# Patient Record
Sex: Female | Born: 1985
Health system: Southern US, Community
[De-identification: ages and names within clinical notes are randomized; demographics above are authoritative.]

## PROBLEM LIST (undated history)

## (undated) ENCOUNTER — Inpatient Hospital Stay (HOSPITAL_COMMUNITY): Payer: Self-pay

## (undated) DIAGNOSIS — F909 Attention-deficit hyperactivity disorder, unspecified type: Secondary | ICD-10-CM

## (undated) DIAGNOSIS — K219 Gastro-esophageal reflux disease without esophagitis: Secondary | ICD-10-CM

## (undated) DIAGNOSIS — G43909 Migraine, unspecified, not intractable, without status migrainosus: Secondary | ICD-10-CM

## (undated) HISTORY — PX: ESOPHAGOGASTRODUODENOSCOPY: SHX1529

## (undated) HISTORY — DX: Migraine, unspecified, not intractable, without status migrainosus: G43.909

## (undated) HISTORY — DX: Attention-deficit hyperactivity disorder, unspecified type: F90.9

## (undated) HISTORY — DX: Gastro-esophageal reflux disease without esophagitis: K21.9

---

## 2009-07-07 HISTORY — PX: COLONOSCOPY: SHX174

## 2010-07-07 HISTORY — PX: BREAST BIOPSY: SHX20

## 2016-01-29 MED FILL — NUVARING VAGINAL RING: 0.12-0.015 | 28 days supply | Qty: 1 | Fill #0

## 2016-02-26 MED FILL — NUVARING VAGINAL RING: 0.12-0.015 | 63 days supply | Qty: 2 | Fill #1

## 2016-03-26 DIAGNOSIS — R635 Abnormal weight gain: Secondary | ICD-10-CM | POA: Diagnosis not present

## 2016-03-26 DIAGNOSIS — Z6825 Body mass index (BMI) 25.0-25.9, adult: Secondary | ICD-10-CM | POA: Diagnosis not present

## 2016-03-26 DIAGNOSIS — K921 Melena: Secondary | ICD-10-CM | POA: Diagnosis not present

## 2016-04-03 DIAGNOSIS — Z01419 Encounter for gynecological examination (general) (routine) without abnormal findings: Secondary | ICD-10-CM | POA: Diagnosis not present

## 2016-04-03 DIAGNOSIS — Z124 Encounter for screening for malignant neoplasm of cervix: Secondary | ICD-10-CM | POA: Diagnosis not present

## 2016-04-03 DIAGNOSIS — N909 Noninflammatory disorder of vulva and perineum, unspecified: Secondary | ICD-10-CM | POA: Diagnosis not present

## 2016-04-03 DIAGNOSIS — Z6826 Body mass index (BMI) 26.0-26.9, adult: Secondary | ICD-10-CM | POA: Diagnosis not present

## 2016-04-03 DIAGNOSIS — Z113 Encounter for screening for infections with a predominantly sexual mode of transmission: Secondary | ICD-10-CM | POA: Diagnosis not present

## 2016-04-03 MED FILL — valACYclovir HCL 1 GM TABS: 1 | 10 days supply | Qty: 20 | Fill #0

## 2016-04-04 MED FILL — LIDOCAINE HCL 2% JELLY: 2 | 10 days supply | Qty: 30 | Fill #0

## 2016-04-23 MED FILL — NUVARING VAGINAL RING: 0.12-0.015 | 84 days supply | Qty: 3 | Fill #0

## 2016-07-08 MED FILL — NUVARING VAGINAL RING: 0.12-0.015 | 84 days supply | Qty: 3 | Fill #1 | Status: TO

## 2016-10-07 MED FILL — NUVARING VAGINAL RING: 0.12-0.015 | 84 days supply | Qty: 3 | Fill #0

## 2016-12-09 DIAGNOSIS — M9905 Segmental and somatic dysfunction of pelvic region: Secondary | ICD-10-CM | POA: Diagnosis not present

## 2016-12-09 DIAGNOSIS — M9902 Segmental and somatic dysfunction of thoracic region: Secondary | ICD-10-CM | POA: Diagnosis not present

## 2016-12-09 DIAGNOSIS — M791 Myalgia: Secondary | ICD-10-CM | POA: Diagnosis not present

## 2016-12-09 DIAGNOSIS — M9903 Segmental and somatic dysfunction of lumbar region: Secondary | ICD-10-CM | POA: Diagnosis not present

## 2017-01-19 MED FILL — NUVARING VAGINAL RING: 0.12-0.015 | 84 days supply | Qty: 3 | Fill #1

## 2017-03-26 DIAGNOSIS — Z6832 Body mass index (BMI) 32.0-32.9, adult: Secondary | ICD-10-CM | POA: Diagnosis not present

## 2017-03-26 DIAGNOSIS — R5383 Other fatigue: Secondary | ICD-10-CM | POA: Diagnosis not present

## 2017-03-26 DIAGNOSIS — R0789 Other chest pain: Secondary | ICD-10-CM | POA: Diagnosis not present

## 2017-03-27 DIAGNOSIS — M25572 Pain in left ankle and joints of left foot: Secondary | ICD-10-CM | POA: Diagnosis not present

## 2017-03-27 DIAGNOSIS — M25571 Pain in right ankle and joints of right foot: Secondary | ICD-10-CM | POA: Diagnosis not present

## 2017-03-30 DIAGNOSIS — M25571 Pain in right ankle and joints of right foot: Secondary | ICD-10-CM | POA: Diagnosis not present

## 2017-04-02 DIAGNOSIS — R5383 Other fatigue: Secondary | ICD-10-CM | POA: Diagnosis not present

## 2017-04-02 DIAGNOSIS — R0789 Other chest pain: Secondary | ICD-10-CM | POA: Diagnosis not present

## 2017-04-02 DIAGNOSIS — Z6832 Body mass index (BMI) 32.0-32.9, adult: Secondary | ICD-10-CM | POA: Diagnosis not present

## 2017-04-20 DIAGNOSIS — Z6825 Body mass index (BMI) 25.0-25.9, adult: Secondary | ICD-10-CM | POA: Diagnosis not present

## 2017-04-20 DIAGNOSIS — Z3040 Encounter for surveillance of contraceptives, unspecified: Secondary | ICD-10-CM | POA: Diagnosis not present

## 2017-04-20 DIAGNOSIS — Z01419 Encounter for gynecological examination (general) (routine) without abnormal findings: Secondary | ICD-10-CM | POA: Diagnosis not present

## 2017-04-20 MED FILL — metroNIDAZOLE 500 MG TABS: 500 | 7 days supply | Qty: 14 | Fill #0

## 2017-04-23 DIAGNOSIS — K625 Hemorrhage of anus and rectum: Secondary | ICD-10-CM | POA: Diagnosis not present

## 2017-04-23 DIAGNOSIS — Z6825 Body mass index (BMI) 25.0-25.9, adult: Secondary | ICD-10-CM | POA: Diagnosis not present

## 2017-04-23 DIAGNOSIS — K649 Unspecified hemorrhoids: Secondary | ICD-10-CM | POA: Diagnosis not present

## 2017-04-23 MED FILL — HEMMOREX-HC 25 MG SUPP: 25 | 3 days supply | Qty: 6 | Fill #0

## 2017-07-23 ENCOUNTER — Ambulatory Visit (HOSPITAL_COMMUNITY): Payer: 59 | Admitting: Licensed Clinical Social Worker

## 2017-08-10 ENCOUNTER — Ambulatory Visit (HOSPITAL_COMMUNITY): Payer: 59 | Admitting: Licensed Clinical Social Worker

## 2017-08-18 ENCOUNTER — Ambulatory Visit (HOSPITAL_COMMUNITY): Payer: 59 | Admitting: Licensed Clinical Social Worker

## 2017-09-04 DIAGNOSIS — M542 Cervicalgia: Secondary | ICD-10-CM | POA: Diagnosis not present

## 2017-09-04 DIAGNOSIS — M9903 Segmental and somatic dysfunction of lumbar region: Secondary | ICD-10-CM | POA: Diagnosis not present

## 2017-09-04 DIAGNOSIS — M9905 Segmental and somatic dysfunction of pelvic region: Secondary | ICD-10-CM | POA: Diagnosis not present

## 2017-09-04 DIAGNOSIS — M222X1 Patellofemoral disorders, right knee: Secondary | ICD-10-CM | POA: Diagnosis not present

## 2017-09-04 DIAGNOSIS — M7918 Myalgia, other site: Secondary | ICD-10-CM | POA: Diagnosis not present

## 2017-09-04 DIAGNOSIS — M9902 Segmental and somatic dysfunction of thoracic region: Secondary | ICD-10-CM | POA: Diagnosis not present

## 2017-09-04 DIAGNOSIS — M7912 Myalgia of auxiliary muscles, head and neck: Secondary | ICD-10-CM | POA: Diagnosis not present

## 2017-09-04 DIAGNOSIS — M222X2 Patellofemoral disorders, left knee: Secondary | ICD-10-CM | POA: Diagnosis not present

## 2017-09-04 DIAGNOSIS — M9901 Segmental and somatic dysfunction of cervical region: Secondary | ICD-10-CM | POA: Diagnosis not present

## 2017-09-16 DIAGNOSIS — M7918 Myalgia, other site: Secondary | ICD-10-CM | POA: Diagnosis not present

## 2017-09-16 DIAGNOSIS — M9902 Segmental and somatic dysfunction of thoracic region: Secondary | ICD-10-CM | POA: Diagnosis not present

## 2017-09-16 DIAGNOSIS — M9905 Segmental and somatic dysfunction of pelvic region: Secondary | ICD-10-CM | POA: Diagnosis not present

## 2017-09-16 DIAGNOSIS — M222X2 Patellofemoral disorders, left knee: Secondary | ICD-10-CM | POA: Diagnosis not present

## 2017-09-16 DIAGNOSIS — M542 Cervicalgia: Secondary | ICD-10-CM | POA: Diagnosis not present

## 2017-09-16 DIAGNOSIS — M9903 Segmental and somatic dysfunction of lumbar region: Secondary | ICD-10-CM | POA: Diagnosis not present

## 2017-09-16 DIAGNOSIS — M222X1 Patellofemoral disorders, right knee: Secondary | ICD-10-CM | POA: Diagnosis not present

## 2017-09-16 DIAGNOSIS — M7912 Myalgia of auxiliary muscles, head and neck: Secondary | ICD-10-CM | POA: Diagnosis not present

## 2017-09-16 DIAGNOSIS — M9901 Segmental and somatic dysfunction of cervical region: Secondary | ICD-10-CM | POA: Diagnosis not present

## 2017-09-28 DIAGNOSIS — M542 Cervicalgia: Secondary | ICD-10-CM | POA: Diagnosis not present

## 2017-09-28 DIAGNOSIS — M222X1 Patellofemoral disorders, right knee: Secondary | ICD-10-CM | POA: Diagnosis not present

## 2017-09-28 DIAGNOSIS — M9902 Segmental and somatic dysfunction of thoracic region: Secondary | ICD-10-CM | POA: Diagnosis not present

## 2017-09-28 DIAGNOSIS — M9901 Segmental and somatic dysfunction of cervical region: Secondary | ICD-10-CM | POA: Diagnosis not present

## 2017-09-28 DIAGNOSIS — M222X2 Patellofemoral disorders, left knee: Secondary | ICD-10-CM | POA: Diagnosis not present

## 2017-09-28 DIAGNOSIS — M7918 Myalgia, other site: Secondary | ICD-10-CM | POA: Diagnosis not present

## 2017-09-28 DIAGNOSIS — M7912 Myalgia of auxiliary muscles, head and neck: Secondary | ICD-10-CM | POA: Diagnosis not present

## 2017-09-28 DIAGNOSIS — M9905 Segmental and somatic dysfunction of pelvic region: Secondary | ICD-10-CM | POA: Diagnosis not present

## 2017-09-28 DIAGNOSIS — M9903 Segmental and somatic dysfunction of lumbar region: Secondary | ICD-10-CM | POA: Diagnosis not present

## 2017-10-16 DIAGNOSIS — M222X1 Patellofemoral disorders, right knee: Secondary | ICD-10-CM | POA: Diagnosis not present

## 2017-10-16 DIAGNOSIS — M222X2 Patellofemoral disorders, left knee: Secondary | ICD-10-CM | POA: Diagnosis not present

## 2017-10-16 DIAGNOSIS — M9901 Segmental and somatic dysfunction of cervical region: Secondary | ICD-10-CM | POA: Diagnosis not present

## 2017-10-16 DIAGNOSIS — M542 Cervicalgia: Secondary | ICD-10-CM | POA: Diagnosis not present

## 2017-10-16 DIAGNOSIS — M7912 Myalgia of auxiliary muscles, head and neck: Secondary | ICD-10-CM | POA: Diagnosis not present

## 2017-10-16 DIAGNOSIS — M9905 Segmental and somatic dysfunction of pelvic region: Secondary | ICD-10-CM | POA: Diagnosis not present

## 2017-10-16 DIAGNOSIS — M9902 Segmental and somatic dysfunction of thoracic region: Secondary | ICD-10-CM | POA: Diagnosis not present

## 2017-10-16 DIAGNOSIS — M7918 Myalgia, other site: Secondary | ICD-10-CM | POA: Diagnosis not present

## 2017-10-16 DIAGNOSIS — M9903 Segmental and somatic dysfunction of lumbar region: Secondary | ICD-10-CM | POA: Diagnosis not present

## 2017-11-03 DIAGNOSIS — M9905 Segmental and somatic dysfunction of pelvic region: Secondary | ICD-10-CM | POA: Diagnosis not present

## 2017-11-03 DIAGNOSIS — M9902 Segmental and somatic dysfunction of thoracic region: Secondary | ICD-10-CM | POA: Diagnosis not present

## 2017-11-03 DIAGNOSIS — M222X1 Patellofemoral disorders, right knee: Secondary | ICD-10-CM | POA: Diagnosis not present

## 2017-11-03 DIAGNOSIS — M542 Cervicalgia: Secondary | ICD-10-CM | POA: Diagnosis not present

## 2017-11-03 DIAGNOSIS — M222X2 Patellofemoral disorders, left knee: Secondary | ICD-10-CM | POA: Diagnosis not present

## 2017-11-03 DIAGNOSIS — M9901 Segmental and somatic dysfunction of cervical region: Secondary | ICD-10-CM | POA: Diagnosis not present

## 2017-11-03 DIAGNOSIS — M7912 Myalgia of auxiliary muscles, head and neck: Secondary | ICD-10-CM | POA: Diagnosis not present

## 2017-11-03 DIAGNOSIS — M7918 Myalgia, other site: Secondary | ICD-10-CM | POA: Diagnosis not present

## 2017-11-03 DIAGNOSIS — M9903 Segmental and somatic dysfunction of lumbar region: Secondary | ICD-10-CM | POA: Diagnosis not present

## 2017-11-09 ENCOUNTER — Ambulatory Visit (INDEPENDENT_AMBULATORY_CARE_PROVIDER_SITE_OTHER): Payer: 59 | Admitting: Licensed Clinical Social Worker

## 2017-11-09 DIAGNOSIS — F331 Major depressive disorder, recurrent, moderate: Secondary | ICD-10-CM

## 2017-11-18 DIAGNOSIS — H1013 Acute atopic conjunctivitis, bilateral: Secondary | ICD-10-CM | POA: Diagnosis not present

## 2017-11-18 DIAGNOSIS — Z0000 Encounter for general adult medical examination without abnormal findings: Secondary | ICD-10-CM | POA: Diagnosis not present

## 2017-11-18 LAB — HEPATIC FUNCTION PANEL
ALT: 17 (ref 7–35)
AST: 16 (ref 13–35)
Alkaline Phosphatase: 62 (ref 25–125)
Bilirubin, Total: 0.3

## 2017-11-18 LAB — LIPID PANEL
Cholesterol: 198 (ref 0–200)
HDL: 77 — AB (ref 35–70)
LDL Cholesterol: 100
Triglycerides: 107 (ref 40–160)

## 2017-11-18 LAB — CBC AND DIFFERENTIAL
HCT: 39 (ref 36–46)
Hemoglobin: 12.9 (ref 12.0–16.0)
Neutrophils Absolute: 54
Platelets: 269 (ref 150–399)
WBC: 4.2

## 2017-11-18 LAB — COMPREHENSIVE METABOLIC PANEL
Albumin: 4.1 (ref 3.5–5.0)
Calcium: 8.9 (ref 8.7–10.7)
GFR calc Af Amer: 97
GFR calc non Af Amer: 84
Globulin: 2.2

## 2017-11-18 LAB — CBC: RBC: 4.44 (ref 3.87–5.11)

## 2017-11-18 LAB — BASIC METABOLIC PANEL
BUN: 11 (ref 4–21)
CO2: 22 (ref 13–22)
Chloride: 105 (ref 99–108)
Creatinine: 0.9 (ref 0.5–1.1)
Glucose: 90
Potassium: 4.8 (ref 3.4–5.3)
Sodium: 139 (ref 137–147)

## 2017-11-18 LAB — HIV ANTIBODY (ROUTINE TESTING W REFLEX): HIV 1&2 Ab, 4th Generation: NONREACTIVE

## 2017-11-18 LAB — TSH: TSH: 2.43 (ref 0.41–5.90)

## 2017-11-24 ENCOUNTER — Ambulatory Visit (INDEPENDENT_AMBULATORY_CARE_PROVIDER_SITE_OTHER): Payer: 59 | Admitting: Psychology

## 2017-11-24 DIAGNOSIS — Z6825 Body mass index (BMI) 25.0-25.9, adult: Secondary | ICD-10-CM | POA: Diagnosis not present

## 2017-11-24 DIAGNOSIS — F908 Attention-deficit hyperactivity disorder, other type: Secondary | ICD-10-CM | POA: Diagnosis not present

## 2017-11-24 DIAGNOSIS — F909 Attention-deficit hyperactivity disorder, unspecified type: Secondary | ICD-10-CM | POA: Diagnosis not present

## 2017-11-24 DIAGNOSIS — F4323 Adjustment disorder with mixed anxiety and depressed mood: Secondary | ICD-10-CM | POA: Diagnosis not present

## 2017-11-24 DIAGNOSIS — Z0000 Encounter for general adult medical examination without abnormal findings: Secondary | ICD-10-CM | POA: Diagnosis not present

## 2017-11-24 DIAGNOSIS — F4321 Adjustment disorder with depressed mood: Secondary | ICD-10-CM

## 2017-11-24 MED FILL — ADDERALL XR 15 MG CAP SA: 15 | 30 days supply | Qty: 30 | Fill #0

## 2017-12-21 MED FILL — NUVARING VAGINAL RING: 0.12-0.015 | 84 days supply | Qty: 3 | Fill #0

## 2017-12-22 ENCOUNTER — Ambulatory Visit: Payer: Self-pay | Admitting: Licensed Clinical Social Worker

## 2017-12-22 ENCOUNTER — Ambulatory Visit: Payer: 59 | Admitting: Psychology

## 2017-12-22 DIAGNOSIS — Z6824 Body mass index (BMI) 24.0-24.9, adult: Secondary | ICD-10-CM | POA: Diagnosis not present

## 2017-12-22 DIAGNOSIS — F909 Attention-deficit hyperactivity disorder, unspecified type: Secondary | ICD-10-CM | POA: Diagnosis not present

## 2017-12-24 MED FILL — ADDERALL XR 15 MG CAP SA: 15 | 30 days supply | Qty: 30 | Fill #0

## 2017-12-25 MED FILL — VALACYCLOVIR HCL 500 MG TAB: 500 | 7 days supply | Qty: 14 | Fill #0

## 2018-01-22 MED FILL — ADDERALL XR 15 MG CAP SA: 15 | 30 days supply | Qty: 30 | Fill #0

## 2018-02-23 MED FILL — ADDERALL XR 15 MG CAP SA: 15 | 30 days supply | Qty: 30 | Fill #0

## 2018-03-15 DIAGNOSIS — M9903 Segmental and somatic dysfunction of lumbar region: Secondary | ICD-10-CM | POA: Diagnosis not present

## 2018-03-15 DIAGNOSIS — M9901 Segmental and somatic dysfunction of cervical region: Secondary | ICD-10-CM | POA: Diagnosis not present

## 2018-03-15 DIAGNOSIS — Z01419 Encounter for gynecological examination (general) (routine) without abnormal findings: Secondary | ICD-10-CM | POA: Diagnosis not present

## 2018-03-15 DIAGNOSIS — M7912 Myalgia of auxiliary muscles, head and neck: Secondary | ICD-10-CM | POA: Diagnosis not present

## 2018-03-15 DIAGNOSIS — M9905 Segmental and somatic dysfunction of pelvic region: Secondary | ICD-10-CM | POA: Diagnosis not present

## 2018-03-15 DIAGNOSIS — N760 Acute vaginitis: Secondary | ICD-10-CM | POA: Diagnosis not present

## 2018-03-15 DIAGNOSIS — M222X1 Patellofemoral disorders, right knee: Secondary | ICD-10-CM | POA: Diagnosis not present

## 2018-03-15 DIAGNOSIS — M222X2 Patellofemoral disorders, left knee: Secondary | ICD-10-CM | POA: Diagnosis not present

## 2018-03-15 DIAGNOSIS — M7918 Myalgia, other site: Secondary | ICD-10-CM | POA: Diagnosis not present

## 2018-03-15 DIAGNOSIS — Z6823 Body mass index (BMI) 23.0-23.9, adult: Secondary | ICD-10-CM | POA: Diagnosis not present

## 2018-03-15 DIAGNOSIS — M9902 Segmental and somatic dysfunction of thoracic region: Secondary | ICD-10-CM | POA: Diagnosis not present

## 2018-03-15 DIAGNOSIS — M542 Cervicalgia: Secondary | ICD-10-CM | POA: Diagnosis not present

## 2018-03-23 DIAGNOSIS — F909 Attention-deficit hyperactivity disorder, unspecified type: Secondary | ICD-10-CM | POA: Diagnosis not present

## 2018-03-23 DIAGNOSIS — Z6824 Body mass index (BMI) 24.0-24.9, adult: Secondary | ICD-10-CM | POA: Diagnosis not present

## 2018-03-25 MED FILL — NUVARING VAGINAL RING: 0.12-0.015 | 84 days supply | Qty: 3 | Fill #0

## 2018-03-25 MED FILL — ADDERALL XR 15 MG CAP SA: 15 | 30 days supply | Qty: 30 | Fill #0

## 2018-03-29 ENCOUNTER — Encounter (HOSPITAL_COMMUNITY): Payer: Self-pay

## 2018-03-29 ENCOUNTER — Ambulatory Visit (HOSPITAL_COMMUNITY): Payer: 59

## 2018-04-27 MED FILL — ADDERALL XR 15 MG CAP SA: 15 | 30 days supply | Qty: 30 | Fill #0

## 2018-06-02 MED FILL — ADDERALL XR 15 MG CAP SA: 15 | 30 days supply | Qty: 30 | Fill #0

## 2018-06-11 DIAGNOSIS — M9903 Segmental and somatic dysfunction of lumbar region: Secondary | ICD-10-CM | POA: Diagnosis not present

## 2018-06-11 DIAGNOSIS — J069 Acute upper respiratory infection, unspecified: Secondary | ICD-10-CM | POA: Diagnosis not present

## 2018-06-11 DIAGNOSIS — M7918 Myalgia, other site: Secondary | ICD-10-CM | POA: Diagnosis not present

## 2018-06-11 DIAGNOSIS — M9901 Segmental and somatic dysfunction of cervical region: Secondary | ICD-10-CM | POA: Diagnosis not present

## 2018-06-11 DIAGNOSIS — M542 Cervicalgia: Secondary | ICD-10-CM | POA: Diagnosis not present

## 2018-06-11 DIAGNOSIS — M9905 Segmental and somatic dysfunction of pelvic region: Secondary | ICD-10-CM | POA: Diagnosis not present

## 2018-06-11 DIAGNOSIS — M9902 Segmental and somatic dysfunction of thoracic region: Secondary | ICD-10-CM | POA: Diagnosis not present

## 2018-06-11 DIAGNOSIS — M7912 Myalgia of auxiliary muscles, head and neck: Secondary | ICD-10-CM | POA: Diagnosis not present

## 2018-06-11 DIAGNOSIS — M545 Low back pain: Secondary | ICD-10-CM | POA: Diagnosis not present

## 2018-06-11 DIAGNOSIS — M546 Pain in thoracic spine: Secondary | ICD-10-CM | POA: Diagnosis not present

## 2018-06-22 DIAGNOSIS — J069 Acute upper respiratory infection, unspecified: Secondary | ICD-10-CM | POA: Diagnosis not present

## 2018-06-22 DIAGNOSIS — Z6823 Body mass index (BMI) 23.0-23.9, adult: Secondary | ICD-10-CM | POA: Diagnosis not present

## 2018-06-22 DIAGNOSIS — F909 Attention-deficit hyperactivity disorder, unspecified type: Secondary | ICD-10-CM | POA: Diagnosis not present

## 2018-07-05 MED FILL — ADDERALL XR 15 MG CAP SA: 15 | 30 days supply | Qty: 30 | Fill #0

## 2018-07-12 MED FILL — ETONOGESTREL-ETHINYL ESTRAD: 0.12-0.015 | 84 days supply | Qty: 3 | Fill #1

## 2018-08-13 MED FILL — ADDERALL XR 15 MG CAP SA: 15 | 30 days supply | Qty: 30 | Fill #0

## 2018-09-10 MED FILL — ADDERALL XR 15 MG CAP SA: 15 | 30 days supply | Qty: 30 | Fill #0

## 2018-09-25 MED FILL — ETONOGESTREL-ETHINYL ESTRAD: 0.12-0.015 | 84 days supply | Qty: 3 | Fill #2

## 2018-09-27 DIAGNOSIS — R6882 Decreased libido: Secondary | ICD-10-CM | POA: Diagnosis not present

## 2018-09-27 DIAGNOSIS — Z719 Counseling, unspecified: Secondary | ICD-10-CM | POA: Diagnosis not present

## 2018-09-27 DIAGNOSIS — F909 Attention-deficit hyperactivity disorder, unspecified type: Secondary | ICD-10-CM | POA: Diagnosis not present

## 2018-09-27 MED FILL — AMPHETAMINE-DEXTROAMPHETAMI: 10 | 30 days supply | Qty: 90 | Fill #0

## 2018-11-05 DIAGNOSIS — F909 Attention-deficit hyperactivity disorder, unspecified type: Secondary | ICD-10-CM | POA: Diagnosis not present

## 2018-11-05 DIAGNOSIS — R6882 Decreased libido: Secondary | ICD-10-CM | POA: Diagnosis not present

## 2018-11-05 DIAGNOSIS — R454 Irritability and anger: Secondary | ICD-10-CM | POA: Diagnosis not present

## 2018-11-05 MED FILL — ATOMOXETINE HCL 40 MG CAPS: 40 | 30 days supply | Qty: 30 | Fill #0

## 2018-11-17 DIAGNOSIS — R51 Headache: Secondary | ICD-10-CM | POA: Diagnosis not present

## 2018-11-17 DIAGNOSIS — K5900 Constipation, unspecified: Secondary | ICD-10-CM | POA: Diagnosis not present

## 2018-11-17 DIAGNOSIS — F909 Attention-deficit hyperactivity disorder, unspecified type: Secondary | ICD-10-CM | POA: Diagnosis not present

## 2018-11-17 MED FILL — MYDAYIS ER 12.5 MG CAPSULE: 12.5 | 30 days supply | Qty: 30 | Fill #0

## 2018-12-14 DIAGNOSIS — F909 Attention-deficit hyperactivity disorder, unspecified type: Secondary | ICD-10-CM | POA: Diagnosis not present

## 2018-12-14 DIAGNOSIS — Z719 Counseling, unspecified: Secondary | ICD-10-CM | POA: Diagnosis not present

## 2018-12-14 DIAGNOSIS — J309 Allergic rhinitis, unspecified: Secondary | ICD-10-CM | POA: Diagnosis not present

## 2018-12-15 MED FILL — MYDAYIS ER 25 MG CAPSULE: 25 | 30 days supply | Qty: 30 | Fill #0

## 2018-12-30 MED FILL — ETONOGESTREL-ETHINYL ESTRAD: 0.12-0.015 | 84 days supply | Qty: 3 | Fill #3

## 2019-02-03 MED FILL — MYDAYIS ER 25 MG CAPSULE: 25 | 30 days supply | Qty: 30 | Fill #0

## 2019-03-04 MED FILL — MYDAYIS ER 25 MG CAPSULE: 25 | 30 days supply | Qty: 30 | Fill #0

## 2019-03-16 DIAGNOSIS — F411 Generalized anxiety disorder: Secondary | ICD-10-CM | POA: Diagnosis not present

## 2019-03-16 DIAGNOSIS — I7300 Raynaud's syndrome without gangrene: Secondary | ICD-10-CM | POA: Diagnosis not present

## 2019-03-16 DIAGNOSIS — R6889 Other general symptoms and signs: Secondary | ICD-10-CM | POA: Diagnosis not present

## 2019-03-16 DIAGNOSIS — F909 Attention-deficit hyperactivity disorder, unspecified type: Secondary | ICD-10-CM | POA: Diagnosis not present

## 2019-03-16 MED FILL — AMLODIPINE 2.5 MG TABLET: 2.5 | 90 days supply | Qty: 90 | Fill #0

## 2019-04-04 MED FILL — ETONOGESTREL-ETHINYL ESTRAD: 0.12-0.015 | 28 days supply | Qty: 1 | Fill #0

## 2019-04-13 DIAGNOSIS — Z23 Encounter for immunization: Secondary | ICD-10-CM | POA: Diagnosis not present

## 2019-04-13 DIAGNOSIS — Z111 Encounter for screening for respiratory tuberculosis: Secondary | ICD-10-CM | POA: Diagnosis not present

## 2019-04-13 DIAGNOSIS — R5383 Other fatigue: Secondary | ICD-10-CM | POA: Diagnosis not present

## 2019-04-13 DIAGNOSIS — N898 Other specified noninflammatory disorders of vagina: Secondary | ICD-10-CM | POA: Diagnosis not present

## 2019-04-13 DIAGNOSIS — Z3040 Encounter for surveillance of contraceptives, unspecified: Secondary | ICD-10-CM | POA: Diagnosis not present

## 2019-04-13 DIAGNOSIS — I7300 Raynaud's syndrome without gangrene: Secondary | ICD-10-CM | POA: Diagnosis not present

## 2019-04-13 DIAGNOSIS — Z01419 Encounter for gynecological examination (general) (routine) without abnormal findings: Secondary | ICD-10-CM | POA: Diagnosis not present

## 2019-04-13 DIAGNOSIS — Z3002 Counseling and instruction in natural family planning to avoid pregnancy: Secondary | ICD-10-CM | POA: Diagnosis not present

## 2019-04-13 DIAGNOSIS — Z6823 Body mass index (BMI) 23.0-23.9, adult: Secondary | ICD-10-CM | POA: Diagnosis not present

## 2019-04-13 LAB — HEPATIC FUNCTION PANEL
ALT: 12 (ref 7–35)
AST: 18 (ref 13–35)
Alkaline Phosphatase: 67 (ref 25–125)
Bilirubin, Total: 0.4

## 2019-04-13 LAB — CBC: RBC: 4.88 (ref 3.87–5.11)

## 2019-04-13 LAB — COMPREHENSIVE METABOLIC PANEL
Albumin: 4.5 (ref 3.5–5.0)
Calcium: 9.2 (ref 8.7–10.7)
GFR calc Af Amer: 95
GFR calc non Af Amer: 82
Globulin: 2.8

## 2019-04-13 LAB — BASIC METABOLIC PANEL
BUN: 9 (ref 4–21)
CO2: 22 (ref 13–22)
Chloride: 101 (ref 99–108)
Creatinine: 0.9 (ref 0.5–1.1)
Glucose: 83
Potassium: 4.2 (ref 3.4–5.3)
Sodium: 137 (ref 137–147)

## 2019-04-13 LAB — VITAMIN D 25 HYDROXY (VIT D DEFICIENCY, FRACTURES): Vit D, 25-Hydroxy: 21.4

## 2019-04-13 LAB — CBC AND DIFFERENTIAL
HCT: 41 (ref 36–46)
Hemoglobin: 14.3 (ref 12.0–16.0)
Neutrophils Absolute: 61
Platelets: 284 (ref 150–399)
WBC: 6

## 2019-04-13 LAB — TSH: TSH: 3.14 (ref 0.41–5.90)

## 2019-04-21 ENCOUNTER — Ambulatory Visit (HOSPITAL_COMMUNITY): Payer: 59

## 2019-04-21 ENCOUNTER — Encounter (HOSPITAL_COMMUNITY): Payer: Self-pay

## 2019-05-02 DIAGNOSIS — F411 Generalized anxiety disorder: Secondary | ICD-10-CM | POA: Diagnosis not present

## 2019-05-02 DIAGNOSIS — F909 Attention-deficit hyperactivity disorder, unspecified type: Secondary | ICD-10-CM | POA: Diagnosis not present

## 2019-05-02 DIAGNOSIS — G4452 New daily persistent headache (NDPH): Secondary | ICD-10-CM | POA: Diagnosis not present

## 2019-05-02 MED FILL — buPROPion HCL ER (XL) 150 M: 150 | 90 days supply | Qty: 90 | Fill #0

## 2019-05-02 MED FILL — ETONOGESTREL-ETHINYL ESTRAD: 0.12-0.015 | 84 days supply | Qty: 3 | Fill #0

## 2019-05-10 ENCOUNTER — Other Ambulatory Visit: Payer: Self-pay

## 2019-05-10 ENCOUNTER — Ambulatory Visit (HOSPITAL_COMMUNITY): Payer: Self-pay | Admitting: Genetic Counselor

## 2019-05-10 ENCOUNTER — Ambulatory Visit (HOSPITAL_COMMUNITY): Payer: 59

## 2019-05-10 ENCOUNTER — Encounter: Payer: Self-pay | Admitting: Genetic Counselor

## 2019-05-10 ENCOUNTER — Ambulatory Visit (HOSPITAL_COMMUNITY): Payer: 59 | Attending: Obstetrics and Gynecology | Admitting: Genetic Counselor

## 2019-05-10 DIAGNOSIS — Z820 Family history of epilepsy and other diseases of the nervous system: Secondary | ICD-10-CM | POA: Diagnosis not present

## 2019-05-10 DIAGNOSIS — Z31430 Encounter of female for testing for genetic disease carrier status for procreative management: Secondary | ICD-10-CM

## 2019-05-10 DIAGNOSIS — Z3169 Encounter for other general counseling and advice on procreation: Secondary | ICD-10-CM

## 2019-05-10 DIAGNOSIS — Z818 Family history of other mental and behavioral disorders: Secondary | ICD-10-CM

## 2019-05-10 DIAGNOSIS — Z8489 Family history of other specified conditions: Secondary | ICD-10-CM | POA: Diagnosis not present

## 2019-05-10 NOTE — Progress Notes (Signed)
05/10/2019  [name] 01-01-1986 MRN: [id number] DOV: 05/10/2019  Rachael [name] presented to the [facility] for a genetics consultation regarding her family history of Rett syndrome. [name] came to her appointment alone due to COVID-19 visitor restrictions.   Indication for genetic counseling - Family history of Rett syndrome  Prenatal history  RachaelHardiestypresented for a preconception consultation; thus, prenatal history was not reviewed.  Family History  A three generation pedigree was drafted and reviewed. The family history is remarkable for the following:  - Rachael [name] has a sister with Rett syndrome. Her sister is now in her 35s but is severely affected and functions around the level of a 35 year old. See Discussion section for more details.  - Rachael [name] has an extensive family history of cancer. She has a paternal aunt who was diagnosed with colorectal cancer in her 35s, a paternal aunt who died of uterine and ovarian cancer in her late 35s, and a paternal aunt who was diagnosed with breast cancer in her early 35s and ultimately passed away from it. She also has a maternal uncle who had prostate cancer in his late 35s or early 35s, and her maternal grandfather also had prostate cancer in his 35s but ultimately died of cholangiocarcinoma. Though most cancers are thought to be sporadic or due to environmental factors, some families appear to have a strong predisposition to cancers. When considering a family history of cancer, we look for common types of cancer in multiple family members occurring at younger than typical ages. We discussed the option of meeting with a cancer genetic counselor to learn about the risks for an inherited cancer syndrome in the family and discuss any possible screening or testing options available. [name] is encouraged to call to schedule an appointment with a cancer genetic counselor at the [facility] [phone]).  - Rachael [name] has a maternal uncle who has a son with hemophilia. We briefly reviewed that hemophilia is inherited in an X-linked fashion, meaning that her cousin had to have inherited hemophilia from his mother. Given that this aunt is not a blood relative, Rachael [name]'s risk of having a child with hemophilia is not increased over the general population.  The remaining family histories were reviewed and found to be noncontributory for birth defects, intellectual disability, recurrent pregnancy loss, and known genetic conditions.    The patient's ethnicity is [place]. The father of the pregnancy's ethnicity is [place], [place], [place], and [place]. Consanguinity was denied. [name] indicated that her husband has Ashkenazi Jewish ancestry. Pedigree will be scanned under Media.  Discussion  Rachael [name] was referred for genetic counseling as her sister has Rett syndrome. Rett syndrome is a genetic brain disorder that occurs almost exclusively in females. At birth, females with classic Rett syndrome experience 6-18 months of apparently normal development before developmentally stagnating and then experiencing rapid regression in language and motor skills. The main findings associated with Rett syndrome include a partial or complete loss of purposeful hand skills, a partial or complete loss of acquired language skills, gait abnormalities, and stereotypic hand movements including hang wringing, squeezing, clapping, tapping, mouthing, or washing/rubbing automatisms. Other signs and symptoms of Rett syndrome include growth retardation, microcephaly, breathing abnormalities, spitting or drooling, unusual eye movements such as intense staring or excessive blinking, cold hands and feet, irritability, sleep disturbances, seizures, and scoliosis. Individuals with Rett syndrome may also have behavioral abnormalities such as inappropriate laughing or screaming spells,  panic-like  attacks, and autistic features.   Almost all cases of Rett syndrome are caused by pathogenic variants in the MECP2 gene located on the X chromosome. This gene provides instructions for creating a protein (MeCP2) that is critical for normal brain function. Although its precise function is unknown, the MeCP2 protein is thought to be involved in maintaining synapses between neurons in the brain. It is also thought to help regulate the activity of other genes in the brain and control the production of different protein in brain cells. Studies suggest that pathogenic variants in the MECP2 gene cause altered or diminished MeCP2 protein to be produced, leading to reduced activity of neurons and impaired ability for neurons to communicate with one another.  Rett syndrome is inherited in an X-linked dominant pattern. This indicates that one copy of an altered MECP2 gene on the X chromosome is sufficient to cause the condition. Given that males have one X chromosome and females have two, males with a pathogenic variant in the MECP2 gene are more severely impacted. Affected males often die in infancy. Those who survive experience severe neonatal-onset encephalopathy marked by microcephaly, abnormal muscle tone, involuntary movements, severe seizures, and breathing abnormalities, as well as parkinsonism, macroorchidism, and intellectual disability. Females with one MECP2 pathogenic variant experience the features of classic Rett syndrome aforementioned. Approximately 99% cases of Rett syndrome occur in individuals with no family history of the condition. Many of these result from de novo (new) pathogenic variants in the MECP2 gene. However, in rare instances (~1%), a female with Rett syndrome may inherit the condition from a seemingly unaffected parent. This can occur due to favorably skewed X-inactivation or through paternal or maternal gonadal mosaicism, all of which have been described in the  literature.  X-inactivation is a natural process in which cells in a female's body inactivate or silence one of their two X chromosomes. This compensates for the extra dose of X chromosome genes that females have compared to their female counterparts with only one X chromosome. It is thought that when females are heterozygous for a pathogenic variant in MECP2 but are seemingly clinically unaffected by Rett syndrome, cells in their body may have inactivated the X chromosome with the MECP2 pathogenic variant. X-inactivation is thus skewed in a favorable manner. However, cells in the body can inactivate the normal X chromosome instead of the X chromosome with the MECP2 pathogenic variant in some individuals. These individuals may then demonstrate symptoms associated with Rett syndrome. Skewed X-inactivation can thus explain how affected females can inherit Rett syndrome from their seemingly unaffected mothers.  Ms. Adele Barthel was counseled that the risk of recurrence of Rett syndrome in her children is likely low given that most cases are sporadic. However, if one of her parents has a change in the MECP2 gene either due to gonadal mosaicism or skewed X-inactivation, Ms. Adele Barthel would have a 50% chance of also carrying the MECP2 variant herself. Thus, she would have up to a 50% chance of having an affected child. If neither of Rachael [name]'s parents have a change in the MECP2 gene, then her sister's Rett syndrome is likely de novo and Rachael [name]'s risk to have an affected child would only be slightly increased over the general population risk. Ms. Adele Barthel indicated that neither of her parents have had genetic testing. For this reason, precise risk assessment is limited. However, MECP2 genetic testing is recommended for all first-degree female relatives of individuals with Rett syndrome, regardless of their clinical status due to  the concern for skewed X-inactivation. Ms. Adele BarthelHardiesty indicated that she was  interested in pursuing MECP2 genetic testing.  We reviewed that there are several options to determine whether a pregnancy will be affected by Rett syndrome, either prior to conception or during pregnancy. Firstly, preimplantation genetic testing for monogenic/single gene conditions (PGT-M)includingRett syndromemay be possible for future pregnancies ifMs. [name] is identified to carry a variantin the MECP2 gene. PGT-Mutilizes in vitro fertilization, with genetic testing forMECP2performed prior to embryoimplantation. Embryos that are unaffected are then transferred to the uterus for implantation. We discussed that this technology is relatively new, and thus the process can be costly.Insurance oftentimes does not cover the cost of the procedure.  Ms. Adele BarthelHardiesty was also counseled about the option of diagnostic testingduring pregnancy for fetal Rett syndrome. We reviewed that diagnostic testing viachorionic villus sampling (CVS) or amniocentesis is available in her future pregnancies to determine whetherthefetus has Rett syndrome if she is identified to be a carrier of a MECP2 pathogenic variant. We discussed the technical aspects ofeachprocedure and quoted up to a 1 in 500 (0.2%) risk for spontaneous pregnancy loss or other adverse pregnancy outcomes as a result of theprocedure. Cells from a placental oramniotic fluid sample allow fordirect genetic testing of theMECP2 gene.However, we reviewed thatif a fetus was confirmed to havea pathogenic variant in MECP2,it would not be possible to predict the symptoms or severity of the condition the babywould experience after birth. Cultured cells from a CVS or amniocentesis samplealsoallow for the visualization of a fetal karyotype, which can detect >99% of chromosomal aberrations. Chromosomal microarray can also be performed to identify smaller deletions or duplications offetalchromosomal material.   RachaelHardiestywas also counseled on  considerations relevant to advanced maternal age, as she will beover the age of 35 when she eventually conceives.We briefly discussed features of trisomy 6621 (Down syndrome), trisomy 2413, and trisomy 6818. These conditions often are not inherited, but instead occurdueto an error in chromosomal division during the formation of sperm and egg cells in a process called nondisjunction. Nondisjunction occurs more frequently as a woman ages.    We reviewed noninvasive prenatal screening (NIPS) as an available screening option for chromosomal aneuploidies during future pregnancies. Specifically, we discussed that NIPS analyzes cell free DNA originating from the placenta that is found in the maternal blood circulation during pregnancy. This test is not diagnostic for chromosome conditions, but can provide information regarding the presence or absence of extra fetal DNA for chromosomes 13, 18 and 21. Thus, it would not identify or rule out all fetal aneuploidy. The reported detection rate is greater than 99% for trisomy 6721, trisomy 2818, and trisomy 7013. The false positive rate is reported to be less than 1% for any of these conditions. We reviewed that it is recommended that Rachael [name] undergoes genetic counseling in future pregnancies to discuss her risk to have a child with a chromosomal aneuploidy as well as her screening options in more detail.  Per ACOG recommendation, carrier screening for hemoglobinopathies, cystic fibrosis (CF) and spinal muscular atrophy (SMA) was discussed including information about the conditions, rationale for testing, autosomal recessive inheritance, and the option of prenatal diagnosis. We also discussed the possibility of carrier screening for additional conditions that are more frequent in the Ashkenazi Jewish population for Rachael [name]'s husband given his ancestry. Ms. Adele BarthelHardiesty indicated that she was interested in starting with carrier screening for herself for CF, SMA, and  hemoglobinopathies at her appointment today.   Ms. Adele BarthelHardiesty had her blood drawn for MECP2 sequencing  and deletion/duplication analysis as well as carrier screening for CF, SMA, and hemoglobinopathies today. Carrier screening results will take 2-3 weeks to be returned, and MECP2 results will take 3-4 weeks to be returned. I will call Rachael [name] when results become available.  I counseled Rachael [name] regarding the above risks and available options. The approximate face-to-face time with the genetic counselor was 35 minutes.  In summary:  Discussed family history of Rett syndrome and options for follow-up testing  Risk up recurrence likely low, but could be up to 50%  Opted to get her blood drawn for MECP2 gene sequencing and deletion/duplication analysis today. We will follow results  Discussed carrier screening for cystic fibrosis, spinal muscular atrophy, and hemoglobinopathies  Opted to undergo carrier screening today. We will follow results  Reviewed aneuploidy screening and diagnostic testing options that will be available during pregnancy  Recommend genetic counseling for future pregnancies due to advanced maternal age  Reviewed other family history concerns  Recommend referral to cancer genetic counseling   Gershon Crane, MS Genetic Counselor

## 2019-05-11 ENCOUNTER — Telehealth (HOSPITAL_COMMUNITY): Payer: Self-pay | Admitting: Genetic Counselor

## 2019-05-11 NOTE — Telephone Encounter (Signed)
LVM for Rachael [name] re: cancer genetic counseling referral. Per Roma Kayser, genetic counselor at the [facility], Rachael [name] can either be referred for cancer genetic counseling through her doctor, or she could call to set up an appointment herself. The scheduling line for cancer genetic counseling at the [facility] is [phone]. I encouraged Rachael [name] to reach out to me if she has any questions; otherwise, I will plan on calling her when her carrier screening and MECP2 genetic testing results are returned.  Buelah Manis, MS Genetic Counselor

## 2019-05-17 ENCOUNTER — Other Ambulatory Visit (HOSPITAL_COMMUNITY): Payer: Self-pay | Admitting: Obstetrics

## 2019-05-24 ENCOUNTER — Telehealth (HOSPITAL_COMMUNITY): Payer: Self-pay | Admitting: Genetic Counselor

## 2019-05-24 NOTE — Telephone Encounter (Signed)
I called Rachael [name] to discuss her negative carrier screening results for cystic fibrosis (CF), spinal muscular atrophy (SMA), and hemoglobinopathies. Rachael [name] wasnegative for the mutations analyzed in the CFTR gene associated with CF.Based on this negative result and her ethnic background, she has a 1 in 260residual risk of being a carrier forCF. Rachael [name] was also identified to have2 copies of the SMN1 gene and was negative for the c. *3+80T>G SNP.Based on this negative result and her ethnic background, she has a 1 in906residual risk of being a carrier for SMA. Rachael [name] also had a normal hemoglobin electrophoresis, significantly reducing her risk of her being a carrier for hemoglobinopathies such as sickle cell disease. All of Rachael [name] negative carrier screening results significantly decrease the chance of her pregnancies being affected by one of these conditions.  Rachael [name] also underwent MECP2 gene sequencing and deletion/duplication analysis due to her family history of Rett syndrome. Rachael [name]'s MECP2 gene sequencing was negative for sequencing variants associated with Rett syndrome. One variant in the MECP2 gene was identified in Rachael [name]; however, this variant has been reported as simply a polymorphism in the literature several times and has been classified as benign by several laboratories, meaning there is likely no association between this MECP2 variant and Rett syndrome.  We discussed that deletion/duplication analysis for the MECP2 gene is still pending. Generally, MECP2 gene sequencing is able to detect 90-95% of pathogenic variants associated with Rett syndrome, while only 5-10% are detected on deletion/duplication analysis. If MECP2 deletion/duplication analysis is negative, this significantly reduces Rachael [name]'s risk of carrying a MECP2 gene variant that could be causative of Rett syndrome. Thus, she would not be at increased risk of having an  affected child. However, we discussed that germline mosaicism (when a pathogenic gene variant is present in egg cells but not blood cells) cannot ever be definitively ruled out, though this phenomenon is exceedingly rare.   I informed Rachael [name] that I would call her as soon as I receive the results from MECP2 deletion/duplication analysis. She requested a copy of her results, so I emailed her a medical record release form to sign and send back to me for our records following our phone conversation. Rachael [name] confirmed that she had no further questions at this time.  Buelah Manis, MS Genetic Counselor

## 2019-05-25 LAB — MECP2 DELETION/DUPLICATION

## 2019-05-25 LAB — INHERITEST(R) CF/SMA PANEL

## 2019-05-25 LAB — HEMOGLOBINOPATHY EVALUATION
Ferritin: 49 ng/mL (ref 15–150)
Hematocrit: 40 % (ref 34.0–46.6)
Hemoglobin: 13.4 g/dL (ref 11.1–15.9)
Hgb A2 Quant: 2.3 % (ref 1.8–3.2)
Hgb A: 97.7 % (ref 96.4–98.8)
Hgb C: 0 %
Hgb F Quant: 0 % (ref 0.0–2.0)
Hgb S: 0 %
Hgb Solubility: NEGATIVE
Hgb Variant: 0 %
MCH: 28.8 pg (ref 26.6–33.0)
MCHC: 33.5 g/dL (ref 31.5–35.7)
MCV: 86 fL (ref 79–97)
Platelets: 226 10*3/uL (ref 150–450)
RBC: 4.65 x10E6/uL (ref 3.77–5.28)
RDW: 13 % (ref 11.7–15.4)
WBC: 5.7 10*3/uL (ref 3.4–10.8)

## 2019-05-25 LAB — MECP2 DNA SEQUENCING

## 2019-05-30 ENCOUNTER — Telehealth (HOSPITAL_COMMUNITY): Payer: Self-pay | Admitting: Genetic Counselor

## 2019-05-30 NOTE — Telephone Encounter (Addendum)
LVM for Rachael [name] re: her negative MECP2 gene deletion/duplication analysis results. Partnered with her previously negative sequencing analysis of the MECP2 gene, these results indicate that Rachael [name] is likely not a carrier for Rett syndrome; thus, this significantly reduces her risk of having a child with Rett syndrome. I encouraged Rachael [name] to reach out to me should she have any questions.  Buelah Manis, MS Genetic Counselor

## 2019-06-01 ENCOUNTER — Ambulatory Visit: Payer: 59 | Admitting: Family Medicine

## 2019-06-22 DIAGNOSIS — Z23 Encounter for immunization: Secondary | ICD-10-CM | POA: Diagnosis not present

## 2019-07-26 MED FILL — ETONOGESTREL-ETHINYL ESTRAD: 0.12-0.015 | 84 days supply | Qty: 3 | Fill #1

## 2019-08-01 MED FILL — buPROPion HCL ER (XL) 150 M: 150 | 10 days supply | Qty: 10 | Fill #0

## 2019-08-08 DIAGNOSIS — R5383 Other fatigue: Secondary | ICD-10-CM | POA: Diagnosis not present

## 2019-08-08 DIAGNOSIS — F411 Generalized anxiety disorder: Secondary | ICD-10-CM | POA: Diagnosis not present

## 2019-08-08 DIAGNOSIS — R454 Irritability and anger: Secondary | ICD-10-CM | POA: Diagnosis not present

## 2019-08-09 MED FILL — buPROPion HCL ER (XL) 150 M: 150 | 90 days supply | Qty: 90 | Fill #0

## 2019-08-18 MED FILL — buPROPion HCL ER (XL) 150 M: 150 | 90 days supply | Qty: 90 | Fill #0

## 2019-10-12 ENCOUNTER — Other Ambulatory Visit: Payer: Self-pay

## 2019-10-12 ENCOUNTER — Ambulatory Visit (INDEPENDENT_AMBULATORY_CARE_PROVIDER_SITE_OTHER): Payer: 59 | Admitting: Family Medicine

## 2019-10-12 ENCOUNTER — Encounter: Payer: Self-pay | Admitting: Family Medicine

## 2019-10-12 ENCOUNTER — Other Ambulatory Visit: Payer: Self-pay | Admitting: Family Medicine

## 2019-10-12 VITALS — BP 108/64 | HR 70 | Temp 97.9°F | Ht 62.0 in | Wt 136.2 lb

## 2019-10-12 DIAGNOSIS — M791 Myalgia, unspecified site: Secondary | ICD-10-CM | POA: Insufficient documentation

## 2019-10-12 DIAGNOSIS — G43909 Migraine, unspecified, not intractable, without status migrainosus: Secondary | ICD-10-CM | POA: Insufficient documentation

## 2019-10-12 DIAGNOSIS — Z800 Family history of malignant neoplasm of digestive organs: Secondary | ICD-10-CM | POA: Diagnosis not present

## 2019-10-12 DIAGNOSIS — G43809 Other migraine, not intractable, without status migrainosus: Secondary | ICD-10-CM

## 2019-10-12 DIAGNOSIS — Z0001 Encounter for general adult medical examination with abnormal findings: Secondary | ICD-10-CM | POA: Diagnosis not present

## 2019-10-12 DIAGNOSIS — Z13220 Encounter for screening for lipoid disorders: Secondary | ICD-10-CM

## 2019-10-12 DIAGNOSIS — F909 Attention-deficit hyperactivity disorder, unspecified type: Secondary | ICD-10-CM | POA: Diagnosis not present

## 2019-10-12 DIAGNOSIS — E559 Vitamin D deficiency, unspecified: Secondary | ICD-10-CM | POA: Insufficient documentation

## 2019-10-12 LAB — COMPREHENSIVE METABOLIC PANEL
ALT: 11 U/L (ref 0–35)
AST: 14 U/L (ref 0–37)
Albumin: 4.1 g/dL (ref 3.5–5.2)
Alkaline Phosphatase: 51 U/L (ref 39–117)
BUN: 10 mg/dL (ref 6–23)
CO2: 26 mEq/L (ref 19–32)
Calcium: 9.1 mg/dL (ref 8.4–10.5)
Chloride: 104 mEq/L (ref 96–112)
Creatinine, Ser: 0.86 mg/dL (ref 0.40–1.20)
GFR: 74.8 mL/min (ref >60.00)
Glucose, Bld: 85 mg/dL (ref 70–99)
Potassium: 4.7 mEq/L (ref 3.5–5.1)
Sodium: 138 mEq/L (ref 135–145)
Total Bilirubin: 0.5 mg/dL (ref 0.2–1.2)
Total Protein: 6.6 g/dL (ref 6.0–8.3)

## 2019-10-12 LAB — LIPID PANEL
Cholesterol: 226 mg/dL — ABNORMAL HIGH (ref 0–200)
HDL: 76.1 mg/dL (ref >39.00)
LDL Cholesterol: 125 mg/dL — ABNORMAL HIGH (ref 0–99)
NonHDL: 149.88
Total CHOL/HDL Ratio: 3
Triglycerides: 122 mg/dL (ref 0.0–149.0)
VLDL: 24.4 mg/dL (ref 0.0–40.0)

## 2019-10-12 LAB — CK: Total CK: 59 U/L (ref 7–177)

## 2019-10-12 LAB — C-REACTIVE PROTEIN: CRP: <1 mg/dL (ref 0.5–20.0)

## 2019-10-12 LAB — SEDIMENTATION RATE: Sed Rate: 11 mm/hr (ref 0–20)

## 2019-10-12 MED ORDER — BUPROPION HCL ER (XL) 150 MG PO TB24: 300.0000 mg | ORAL_TABLET | Freq: Every day | ORAL | 11 refills | Status: DC

## 2019-10-12 NOTE — Progress Notes (Signed)
Chief Complaint:  Rachael [name] is a 34 y.o. female who presents today for her annual comprehensive physical exam.  She is a new patient.   Assessment/Plan:  Chronic Problems Addressed Today: Family history of colon cancer Will need colon cancer screening start at age 44.  Vitamin D deficiency Low on most recent set of labs from about 6 months ago.  Recommended patient start 5000 international units daily.  Will recheck in 3 to 6 months.  Hopefully this should help some with her myalgias as well.  Myalgia Unclear etiology.  Recent labs notable for low vitamin D.  Will replenish as noted above.  Also check CK, ESR, and sed rate today.  Depending on response to vitamin D repletion and above labs may need referral to sports medicine if continues to have issues.  Migraines Symptoms are progressively worsening.  Normal neurologic exam today, however given associated neuro deficits.  Migraines and progressive nature, will check MRI to rule out other potential causes.  Discussed starting medications however she would like to defer for today.  ADHD We will increase dose of Wellbutrin to 300 mg daily.  She will check in with me in a few weeks via MyChart.  Could increase to 450 mg depending on response.  May consider trial of Intuniv if still has issues with ADHD after increase dose of Wellbutrin.  Preventative Healthcare: Follows with GYN.  Is up-to-date on Pap smear.  Has had both Covid vaccines.  Check lipid panel.  Patient Counseling(The following topics were reviewed and/or handout was given):  -Nutrition: Stressed importance of moderation in sodium/caffeine intake, saturated fat and cholesterol, caloric balance, sufficient intake of fresh fruits, vegetables, and fiber.  -Stressed the importance of regular exercise.   -Substance Abuse: Discussed cessation/primary prevention of tobacco, alcohol, or other drug use; driving or other dangerous activities under the influence; availability of  treatment for abuse.   -Injury prevention: Discussed safety belts, safety helmets, smoke detector, smoking near bedding or upholstery.   -Sexuality: Discussed sexually transmitted diseases, partner selection, use of condoms, avoidance of unintended pregnancy and contraceptive alternatives.   -Dental health: Discussed importance of regular tooth brushing, flossing, and dental visits.  -Health maintenance and immunizations reviewed. Please refer to Health maintenance section.  Return to care in 1 year for next preventative visit.     Subjective:  HPI:  Patient has several issues today that she would like to discuss outlined below:  #Migraines Several year history.  Symptoms seem to be progressively worsening over the last several months to years.  Has associated numbness and tingling.  Has tried over-the-counter medications with modest improvement.  Has migraines multiple times per week.  Sometimes debilitating.  Has never been on prescription medications in the past.  #ADHD Diagnosed in 2018.  Has tried several medications in the past including Adderall, Mydayis, and Strattera.  All of these cause significant side effects.  She is currently on Wellbutrin 150 mg daily which helps however still has issues with staying focused.  She is tolerating this well without significant side effects.  #Myalgias Started about a year ago.  Located in upper back and neck.  Will have shooting pains in the bilateral legs as well.  Symptoms seem to be progressively worsening.  Over-the-counter medications helped modestly.  No fevers or chills.  No weight loss.  Recently had blood work and was found to have elevated T4 and T3 but normal TSH.  Also found to have low vitamin D.  Lifestyle Diet: None.  Exercise: None.   Depression screen PHQ 2/9 10/12/2019  Decreased Interest 0  Down, Depressed, Hopeless 0  PHQ - 2 Score 0    Health Maintenance Due  Topic Date Due  . HIV Screening  Never done     ROS: Per  HPI, otherwise a complete review of systems was negative.   PMH:  The following were reviewed and entered/updated in epic: Past Medical History:  Diagnosis Date  . ADHD   . GERD (gastroesophageal reflux disease)   . Migraines    Patient Active Problem List   Diagnosis Date Noted  . ADHD 10/12/2019  . Migraines 10/12/2019  . Myalgia 10/12/2019  . Vitamin D deficiency 10/12/2019  . Family history of colon cancer 10/12/2019   Past Surgical History:  Procedure Laterality Date  . BREAST BIOPSY  2012  . COLONOSCOPY  2011    Family History  Problem Relation Age of Onset  . Prostate cancer Maternal Grandfather        dx in 38s  . Cancer Maternal Grandfather        colangiocarcinoma  . Breast cancer Paternal Aunt        dx in 35s  . Ovarian cancer Paternal Aunt        dx in 65s  . Uterine cancer Paternal Aunt        dx in 38s  . Colon cancer Paternal Aunt        dx in 68s  . Prostate cancer Maternal Uncle        dx in 40s-50s    Medications- reviewed and updated Current Outpatient Medications  Medication Sig Dispense Refill  . buPROPion (WELLBUTRIN XL) 150 MG 24 hr tablet Take 2 tablets (300 mg total) by mouth daily. 60 tablet 11  . etonogestrel-ethinyl estradiol (NUVARING) 0.12-0.015 MG/24HR vaginal ring Place 1 each vaginally every 28 (twenty-eight) days. Insert vaginally and leave in place for 3 consecutive weeks, then remove for 1 week.    Marland Kitchen ibuprofen (ADVIL) 200 MG tablet Take 200 mg by mouth every 6 (six) hours as needed.    . Magnesium 250 MG TABS Take by mouth.    . naproxen (NAPROSYN) 250 MG tablet Take by mouth 2 (two) times daily with a meal.    . Potassium 99 MG TABS Take by mouth.     No current facility-administered medications for this visit.    Allergies-reviewed and updated No Known Allergies  Social History   Socioeconomic History  . Marital status: Unknown    Spouse name: Not on file  . Number of children: Not on file  . Years of education:  Not on file  . Highest education level: Not on file  Occupational History  . Not on file  Tobacco Use  . Smoking status: Never Smoker  . Smokeless tobacco: Never Used  Substance and Sexual Activity  . Alcohol use: Yes  . Drug use: Never  . Sexual activity: Not on file  Other Topics Concern  . Not on file  Social History Narrative  . Not on file   Social Determinants of Health   Financial Resource Strain:   . Difficulty of Paying Living Expenses:   Food Insecurity:   . Worried About Charity fundraiser in the Last Year:   . Arboriculturist in the Last Year:   Transportation Needs:   . Film/video editor (Medical):   Marland Kitchen Lack of Transportation (Non-Medical):   Physical Activity:   . Days of Exercise  per Week:   . Minutes of Exercise per Session:   Stress:   . Feeling of Stress :   Social Connections:   . Frequency of Communication with Friends and Family:   . Frequency of Social Gatherings with Friends and Family:   . Attends Religious Services:   . Active Member of Clubs or Organizations:   . Attends Archivist Meetings:   Marland Kitchen Marital Status:         Objective:  Physical Exam: BP 108/64   Pulse 70   Temp 97.9 F (36.6 C)   Ht 5' 2"  (1.575 m)   Wt 136 lb 3.2 oz (61.8 kg)   LMP 09/14/2019   SpO2 99%   BMI 24.91 kg/m   Body mass index is 24.91 kg/m. Wt Readings from Last 3 Encounters:  10/12/19 136 lb 3.2 oz (61.8 kg)   Gen: NAD, resting comfortably HEENT: TMs normal bilaterally. OP clear. No thyromegaly noted.  CV: RRR with no murmurs appreciated Pulm: NWOB, CTAB with no crackles, wheezes, or rhonchi GI: Normal bowel sounds present. Soft, Nontender, Nondistended. MSK: no edema, cyanosis, or clubbing noted Skin: warm, dry Neuro: CN2-12 grossly intact. Strength 5/5 in upper and lower extremities. Reflexes symmetric and intact bilaterally.  Psych: Normal affect and thought content     Luciel Brickman M. Jerline Pain, MD 10/12/2019 9:20 AM

## 2019-10-12 NOTE — Assessment & Plan Note (Signed)
Symptoms are progressively worsening.  Normal neurologic exam today, however given associated neuro deficits.  Migraines and progressive nature, will check MRI to rule out other potential causes.  Discussed starting medications however she would like to defer for today.

## 2019-10-12 NOTE — Assessment & Plan Note (Signed)
We will increase dose of Wellbutrin to 300 mg daily.  She will check in with me in a few weeks via MyChart.  Could increase to 450 mg depending on response.  May consider trial of Intuniv if still has issues with ADHD after increase dose of Wellbutrin.

## 2019-10-12 NOTE — Assessment & Plan Note (Signed)
Low on most recent set of labs from about 6 months ago.  Recommended patient start 5000 international units daily.  Will recheck in 3 to 6 months.  Hopefully this should help some with her myalgias as well.

## 2019-10-12 NOTE — Patient Instructions (Addendum)
It was very nice to see you today!  Please increase your Wellbutrin to 300 mg daily.  Check in with me in a couple of weeks to me know how it is working for you.  We will check an MRI to make sure there are no other posterior worsening migraines.  At some point in the future we can consider starting a as needed medication or a medication that you take daily to prevent migraines.  We will check additional blood work to look for other causes of your muscle aches.  Please make sure that you are getting 5000 international units of vitamin D.  We should recheck your vitamin D level in 3 to 6 months.  Unfortunately we do not have genetic screening for colon cancer in our lab.  Come back in 1 year for your next physical, or sooner if needed.  Take care, Dr Jerline Pain  Please try these tips to maintain a healthy lifestyle:   Eat at least 3 REAL meals and 1-2 snacks per day.  Aim for no more than 5 hours between eating.  If you eat breakfast, please do so within one hour of getting up.    Each meal should contain half fruits/vegetables, one quarter protein, and one quarter carbs (no bigger than a computer mouse)   Cut down on sweet beverages. This includes juice, soda, and sweet tea.     Drink at least 1 glass of water with each meal and aim for at least 8 glasses per day   Exercise at least 150 minutes every week.    Preventive Care 36-20 Years Old, Female Preventive care refers to visits with your health care provider and lifestyle choices that can promote health and wellness. This includes:  A yearly physical exam. This may also be called an annual well check.  Regular dental visits and eye exams.  Immunizations.  Screening for certain conditions.  Healthy lifestyle choices, such as eating a healthy diet, getting regular exercise, not using drugs or products that contain nicotine and tobacco, and limiting alcohol use. What can I expect for my preventive care visit? Physical exam  Your health care provider will check your:  Height and weight. This may be used to calculate body mass index (BMI), which tells if you are at a healthy weight.  Heart rate and blood pressure.  Skin for abnormal spots. Counseling Your health care provider may ask you questions about your:  Alcohol, tobacco, and drug use.  Emotional well-being.  Home and relationship well-being.  Sexual activity.  Eating habits.  Work and work Statistician.  Method of birth control.  Menstrual cycle.  Pregnancy history. What immunizations do I need?  Influenza (flu) vaccine  This is recommended every year. Tetanus, diphtheria, and pertussis (Tdap) vaccine  You may need a Td booster every 10 years. Varicella (chickenpox) vaccine  You may need this if you have not been vaccinated. Human papillomavirus (HPV) vaccine  If recommended by your health care provider, you may need three doses over 6 months. Measles, mumps, and rubella (MMR) vaccine  You may need at least one dose of MMR. You may also need a second dose. Meningococcal conjugate (MenACWY) vaccine  One dose is recommended if you are age 56-21 years and a first-year college student living in a residence hall, or if you have one of several medical conditions. You may also need additional booster doses. Pneumococcal conjugate (PCV13) vaccine  You may need this if you have certain conditions and were not previously  vaccinated. Pneumococcal polysaccharide (PPSV23) vaccine  You may need one or two doses if you smoke cigarettes or if you have certain conditions. Hepatitis A vaccine  You may need this if you have certain conditions or if you travel or work in places where you may be exposed to hepatitis A. Hepatitis B vaccine  You may need this if you have certain conditions or if you travel or work in places where you may be exposed to hepatitis B. Haemophilus influenzae type b (Hib) vaccine  You may need this if you have  certain conditions. You may receive vaccines as individual doses or as more than one vaccine together in one shot (combination vaccines). Talk with your health care provider about the risks and benefits of combination vaccines. What tests do I need?  Blood tests  Lipid and cholesterol levels. These may be checked every 5 years starting at age 107.  Hepatitis C test.  Hepatitis B test. Screening  Diabetes screening. This is done by checking your blood sugar (glucose) after you have not eaten for a while (fasting).  Sexually transmitted disease (STD) testing.  BRCA-related cancer screening. This may be done if you have a family history of breast, ovarian, tubal, or peritoneal cancers.  Pelvic exam and Pap test. This may be done every 3 years starting at age 62. Starting at age 37, this may be done every 5 years if you have a Pap test in combination with an HPV test. Talk with your health care provider about your test results, treatment options, and if necessary, the need for more tests. Follow these instructions at home: Eating and drinking   Eat a diet that includes fresh fruits and vegetables, whole grains, lean protein, and low-fat dairy.  Take vitamin and mineral supplements as recommended by your health care provider.  Do not drink alcohol if: ? Your health care provider tells you not to drink. ? You are pregnant, may be pregnant, or are planning to become pregnant.  If you drink alcohol: ? Limit how much you have to 0-1 drink a day. ? Be aware of how much alcohol is in your drink. In the [place] one drink equals one 12 oz bottle of beer (355 mL), one 5 oz glass of wine (148 mL), or one 1 oz glass of hard liquor (44 mL). Lifestyle  Take daily care of your teeth and gums.  Stay active. Exercise for at least 30 minutes on 5 or more days each week.  Do not use any products that contain nicotine or tobacco, such as cigarettes, e-cigarettes, and chewing tobacco. If you need help  quitting, ask your health care provider.  If you are sexually active, practice safe sex. Use a condom or other form of birth control (contraception) in order to prevent pregnancy and STIs (sexually transmitted infections). If you plan to become pregnant, see your health care provider for a preconception visit. What's next?  Visit your health care provider once a year for a well check visit.  Ask your health care provider how often you should have your eyes and teeth checked.  Stay up to date on all vaccines. This information is not intended to replace advice given to you by your health care provider. Make sure you discuss any questions you have with your health care provider. Document Revised: 03/04/2018 Document Reviewed: 03/04/2018 Elsevier Patient Education  2020 [employer].

## 2019-10-12 NOTE — Assessment & Plan Note (Signed)
Will need colon cancer screening start at age 34.

## 2019-10-12 NOTE — Assessment & Plan Note (Signed)
Unclear etiology.  Recent labs notable for low vitamin D.  Will replenish as noted above.  Also check CK, ESR, and sed rate today.  Depending on response to vitamin D repletion and above labs may need referral to sports medicine if continues to have issues.

## 2019-10-13 NOTE — Progress Notes (Signed)
Please inform patient of the following:  Cholesterol mildly elevated but everything else is NORMAL. Do not need to make any changes to her treatment plan at this time. Would like for her to continue vitamin D 5000IU daily as we discussed. We will call her once we get results of the MRI. Would like for her to check in with [place] ina few weeks to let us know how the increased dose of wellbutrin is working.   Rachael [name]. [name], MD 10/13/2019 9:29 AM

## 2019-10-18 MED FILL — ETONOGESTREL-ETHINYL ESTRAD: 0.12-0.015 | 84 days supply | Qty: 3 | Fill #2

## 2019-10-20 ENCOUNTER — Telehealth: Payer: Self-pay | Admitting: Family Medicine

## 2019-10-20 NOTE — Telephone Encounter (Signed)
ROI Faxed to Dr. Maryelizabeth Rowan 4/15/21fbg

## 2019-10-24 DIAGNOSIS — Z23 Encounter for immunization: Secondary | ICD-10-CM | POA: Diagnosis not present

## 2019-10-25 NOTE — Telephone Encounter (Signed)
[facility] Dept received medical records from Maryelizabeth Rowan MD. Sending 50 pages via interoffice mail to [place] location 10/25/19  [facility]

## 2019-10-27 ENCOUNTER — Encounter: Payer: Self-pay | Admitting: Family Medicine

## 2019-10-31 MED FILL — buPROPion HCL ER (XL) 150 M: 150 | 90 days supply | Qty: 90 | Fill #1

## 2019-11-10 ENCOUNTER — Other Ambulatory Visit: Payer: 59

## 2019-11-18 ENCOUNTER — Other Ambulatory Visit: Payer: Self-pay | Admitting: *Deleted

## 2019-11-18 ENCOUNTER — Telehealth: Payer: Self-pay | Admitting: Family Medicine

## 2019-11-18 MED ORDER — BUPROPION HCL ER (XL) 150 MG PO TB24: 300.0000 mg | ORAL_TABLET | Freq: Every day | ORAL | 0 refills | Status: DC

## 2019-11-18 NOTE — Telephone Encounter (Signed)
Rx send to [facility] in [facility]

## 2019-11-18 NOTE — Telephone Encounter (Signed)
Patient is visiting parents and forgot (Wellbutrin ex) Patient wants to know if he will call in 3 day Supply. [phone] in [place]. Please Advise.jk

## 2019-11-18 NOTE — Telephone Encounter (Signed)
Ok with me. Please place any necessary orders. 

## 2019-11-18 NOTE — Telephone Encounter (Signed)
Please advise 

## 2019-12-22 MED FILL — buPROPion HCL ER (XL) 150 M: 150 | 30 days supply | Qty: 60 | Fill #0

## 2020-01-17 MED FILL — buPROPion HCL ER (XL) 150 M: 150 | 30 days supply | Qty: 60 | Fill #1

## 2020-01-18 MED FILL — ETONOGESTREL-ETHINYL ESTRAD: 0.12-0.015 | 84 days supply | Qty: 3 | Fill #3

## 2020-02-20 MED FILL — buPROPion HCL ER (XL) 150 M: 150 | 30 days supply | Qty: 60 | Fill #2

## 2020-03-06 ENCOUNTER — Telehealth: Payer: Self-pay | Admitting: *Deleted

## 2020-03-06 ENCOUNTER — Telehealth: Payer: Self-pay | Admitting: Family Medicine

## 2020-03-06 NOTE — Telephone Encounter (Signed)
Patient scheduled an appointment through [facility] for the following reasons :  Chest pain, muscle pain, palpitations - on & off for a few months now, left of sternum - clavicle to last rib & spine from same points as front, around into side armpit area (muscle ache usually connected all the way around on the left side from sternum to spine & neck) pain is sometimes there when I touch my muscles, sometimes the touch does not elicit the pain and if feels deeper (visceral), could it just be indigestion for all this time or something else. Can't keep feeling like its more. SENT PATIENT TO [facility] FOR TRIAGE.

## 2020-03-06 NOTE — Telephone Encounter (Signed)
[facility] called back and advised patient to go to ED but patient refused.

## 2020-03-06 NOTE — Telephone Encounter (Signed)
Call patient x 2 First call was hung up Second call pt stated has muscle pain for months  Had appointment with PCP on sep 13. Advise Pt if pain worsen go to UC or ER.

## 2020-03-19 ENCOUNTER — Ambulatory Visit: Payer: 59 | Admitting: Family Medicine

## 2020-03-19 ENCOUNTER — Encounter: Payer: Self-pay | Admitting: Family Medicine

## 2020-03-19 ENCOUNTER — Other Ambulatory Visit: Payer: Self-pay

## 2020-03-19 VITALS — BP 105/71 | HR 71 | Temp 97.7°F | Ht 62.0 in | Wt 138.2 lb

## 2020-03-19 DIAGNOSIS — R079 Chest pain, unspecified: Secondary | ICD-10-CM

## 2020-03-19 LAB — POCT URINE PREGNANCY: Preg Test, Ur: NEGATIVE

## 2020-03-19 NOTE — Patient Instructions (Signed)
It was very nice to see you today!  Your EKG today is normal.  I do not think this is anything to do with your heart but we will check a CT scan to rule this out.  We will also look for any lung issues with this.  We will check blood work and urine sample today.  Take care, Dr [name]  Please try these tips to maintain a healthy lifestyle:   Eat at least 3 REAL meals and 1-2 snacks per day.  Aim for no more than 5 hours between eating.  If you eat breakfast, please do so within one hour of getting up.    Each meal should contain half fruits/vegetables, one quarter protein, and one quarter carbs (no bigger than a computer mouse)   Cut down on sweet beverages. This includes juice, soda, and sweet tea.     Drink at least 1 glass of water with each meal and aim for at least 8 glasses per day   Exercise at least 150 minutes every week.

## 2020-03-19 NOTE — Progress Notes (Signed)
   Rachael [name] is a 34 y.o. female who presents today for an office visit.  Assessment/Plan:  New/Acute Problems: Chest pain/arm pain History is atypical for cardiac etiology though she does have some exertional component.  EKG today with normal sinus rhythm and no ischemic changes.  Doubt PE due to negative PERC.  Possibly musculoskeletal.  She does have midline thoracic back tenderness which is contributing.  Given patient's degree of concern in addition to history of thermogenic use in her 36s, I think it is reasonable to check cardiac CT to rule out cardiac disease.  This will also allow [place] to rule out several pulmonary etiologies as well.  If her cardiac CT is normal would consider referral to PT or sports medicine.     Subjective:  HPI:  Patient here with intermittent left-sided chest pain for the last several months.  Was initially occurring about once per week but over the last few weeks has been happening 3-4 times per week.  Symptoms last for about 5 minutes to an hour.  They can occur randomly.  Symptoms are worse with exertion.  She has pain on the left side of her chest and arm with exercise.  Some associated nausea.  Feels like her left arm is going down.  Pain also radiates into her left back.  Does not think it is reflux that she is aware when she has having reflux sensation and this is different.  She will sometimes break into a sweat when symptoms happen.  No other obvious precipitating events.  No specific treatments tried.       Objective:  Physical Exam: BP 105/71   Pulse 71   Temp 97.7 F (36.5 C) (Temporal)   Ht 5\' 2"  (1.575 m)   Wt 138 lb 3.2 oz (62.7 kg)   LMP 03/17/2020   SpO2 100%   BMI 25.28 kg/m   Gen: No acute distress, resting comfortably CV: Regular rate and rhythm with no murmurs appreciated Pulm: Normal work of breathing, clear to auscultation bilaterally with no crackles, wheezes, or rhonchi MSK: No chest wall tenderness.  Some midline back  tenderness Neuro: Grossly normal, moves all extremities Psych: Normal affect and thought content  EKG: Normal sinus rhythm.  No ischemic changes  Time Spent: 37 minutes of total time was spent on the date of the encounter performing the following actions: chart review prior to seeing the patient, obtaining history, performing a medically necessary exam, counseling on the treatment plan, reviewing her EKG, placing orders, and documenting in our EHR.        05/17/2020. Katina Degree, MD 03/19/2020 9:19 AM

## 2020-03-20 LAB — BASIC METABOLIC PANEL
BUN: 14 mg/dL (ref 7–25)
CO2: 28 mmol/L (ref 20–32)
Calcium: 9.2 mg/dL (ref 8.6–10.2)
Chloride: 102 mmol/L (ref 98–110)
Creat: 0.83 mg/dL (ref 0.50–1.10)
Glucose, Bld: 78 mg/dL (ref 65–99)
Potassium: 4.2 mmol/L (ref 3.5–5.3)
Sodium: 137 mmol/L (ref 135–146)

## 2020-03-20 NOTE — Progress Notes (Signed)
Please inform patient of the following:  Blood work and pregnancy test are NORMAL - we will contact her with the results of the scan once they are back.  Rachael [name]. [name], MD 03/20/2020 9:56 AM

## 2020-03-22 MED FILL — buPROPion HCL ER (XL) 150 M: 150 | 30 days supply | Qty: 60 | Fill #3

## 2020-03-27 ENCOUNTER — Encounter: Payer: Self-pay | Admitting: Family Medicine

## 2020-03-29 ENCOUNTER — Other Ambulatory Visit: Payer: Self-pay | Admitting: *Deleted

## 2020-03-29 DIAGNOSIS — Z111 Encounter for screening for respiratory tuberculosis: Secondary | ICD-10-CM

## 2020-04-04 ENCOUNTER — Ambulatory Visit (INDEPENDENT_AMBULATORY_CARE_PROVIDER_SITE_OTHER)
Admission: RE | Admit: 2020-04-04 | Discharge: 2020-04-04 | Disposition: A | Discharge status: Home / Self Care | Payer: Self-pay | Source: Ambulatory Visit | Attending: Family Medicine | Admitting: Family Medicine

## 2020-04-04 ENCOUNTER — Other Ambulatory Visit: Payer: 59

## 2020-04-04 ENCOUNTER — Other Ambulatory Visit: Payer: Self-pay

## 2020-04-04 DIAGNOSIS — Z111 Encounter for screening for respiratory tuberculosis: Secondary | ICD-10-CM | POA: Diagnosis not present

## 2020-04-04 DIAGNOSIS — R079 Chest pain, unspecified: Secondary | ICD-10-CM

## 2020-04-04 MED FILL — ETONOGESTREL-ETHINYL ESTRAD: 0.12-0.015 | 84 days supply | Qty: 3 | Fill #4

## 2020-04-05 NOTE — Progress Notes (Signed)
Please inform patient of the following:  CT scan is normal. No evidence of cardiac or pulmonary issues. Recommend referral to sports med if she is still having issues.  Rachael [name]. [name], MD 04/05/2020 1:37 PM

## 2020-04-07 LAB — QUANTIFERON-TB GOLD PLUS
Mitogen-NIL: >10 IU/mL
NIL: 0.02 IU/mL
QuantiFERON-TB Gold Plus: NEGATIVE
TB1-NIL: 0.01 IU/mL
TB2-NIL: 0.02 IU/mL

## 2020-04-09 NOTE — Progress Notes (Signed)
Please inform patient of the following:  Quantiferon is is negative.   Katina Degree. [name], MD 04/09/2020 8:12 AM

## 2020-04-23 MED FILL — buPROPion HCL ER (XL) 150 M: 150 | 30 days supply | Qty: 60 | Fill #4

## 2020-04-29 ENCOUNTER — Telehealth: Payer: 59 | Admitting: Physician Assistant

## 2020-04-29 DIAGNOSIS — K30 Functional dyspepsia: Secondary | ICD-10-CM

## 2020-04-29 NOTE — Progress Notes (Signed)
Hello,  Im sorry that you are feeling so bad. It sounds like you are having symptoms that are resistant to your current regime. Unfortunately there are not any other medications that I could add to what you are taking. The Prilosec can take several weeks to begin working so I would continue on the current plan. I would recommend that you reach out to your primary care doctor for directions for the next step in management as you may require testing for peptic ulcer and potential gastroenterology referral. Again Im very sorry you are not feeling well and wish there was more I could do for you.    If you do not have a PCP, [facility] offers a free physician referral service available at [phone]. Our trained staff has the experience, knowledge and resources to put you in touch with a physician who is right for you.    If you are having a true medical emergency please call 911.  NOTE: If you entered your credit card information for this eVisit, you will not be charged. You may see a "hold" on your card for the $35 but that hold will drop off and you will not have a charge processed.  Your e-visit answers were reviewed by a board certified advanced clinical practitioner to complete your personal care plan.  Thank you for using e-Visits. Greater than 5 minutes, yet less than 10 minutes of time have been spent researching, coordinating, and implementing care for this patient today

## 2020-05-16 ENCOUNTER — Other Ambulatory Visit: Payer: Self-pay

## 2020-05-16 ENCOUNTER — Encounter: Payer: Self-pay | Admitting: Family Medicine

## 2020-05-16 ENCOUNTER — Telehealth: Payer: Self-pay | Admitting: *Deleted

## 2020-05-16 ENCOUNTER — Ambulatory Visit: Payer: 59 | Admitting: Family Medicine

## 2020-05-16 DIAGNOSIS — K219 Gastro-esophageal reflux disease without esophagitis: Secondary | ICD-10-CM | POA: Diagnosis not present

## 2020-05-16 MED ORDER — SUCRALFATE 1 GM/10ML PO SUSP: 1.0000 g | Freq: Three times a day (TID) | ORAL | 0 refills | Status: DC

## 2020-05-16 MED FILL — SUCRALFATE 1 GM TABLET: 1 | 10 days supply | Qty: 40 | Fill #0

## 2020-05-16 NOTE — Telephone Encounter (Signed)
Pharmacy stated medication  Carafate suspension cost $122. Carafate table #40 cost $9.80   Ok to change per Dr [name]

## 2020-05-16 NOTE — Progress Notes (Signed)
   Rachael [name] is a 34 y.o. female who presents today for an office visit.  Assessment/Plan:  Chronic Problems Addressed Today: GERD (gastroesophageal reflux disease) Significantly worsening.  Benign abdominal exam.  No red flags.  She has had minimal relief with PPI.  Concern for possible PUD.  We will check for H. pylori today.  Start Carafate.  She will continue omeprazole.  We will also place referral to GI.     Subjective:  HPI:  Patient here with flareup of reflux.  Started a couple months ago.  Tried several over-the-counter medications including Mylanta, Pepcid, Tums, and several PPIs.  Has had minimal relief with all these.  Pain located in the epigastric area and radiates into her chest and back of her throat.  Has had much more regurgitation and bloating recently as well.  Has had more pain in her teeth as well.       Objective:  Physical Exam: BP 96/63   Pulse 79   Temp 97.7 F (36.5 C) (Temporal)   Ht 5\' 2"  (1.575 m)   Wt 139 lb (63 kg)   LMP 05/11/2020   SpO2 99%   BMI 25.42 kg/m   Gen: No acute distress, resting comfortably CV: Regular rate and rhythm with no murmurs appreciated Pulm: Normal work of breathing, clear to auscultation bilaterally with no crackles, wheezes, or rhonchi Neuro: Grossly normal, moves all extremities Psych: Normal affect and thought content      Tanush Drees M. 13/11/2019, MD 05/16/2020 8:22 AM

## 2020-05-16 NOTE — Assessment & Plan Note (Addendum)
Significantly worsening.  Benign abdominal exam.  No red flags.  She has had minimal relief with PPI.  Concern for possible PUD.  We will check for H. pylori today.  Start Carafate.  She will continue omeprazole.  We will also place referral to GI.

## 2020-05-16 NOTE — Patient Instructions (Signed)
It was very nice to see you today!  We will check for H pylori today.  Please resume the omeprazole and start Carafate when you get home.  I will place a referral for you to see a GI specialist.  Take care, Dr [name]  Please try these tips to maintain a healthy lifestyle:   Eat at least 3 REAL meals and 1-2 snacks per day.  Aim for no more than 5 hours between eating.  If you eat breakfast, please do so within one hour of getting up.    Each meal should contain half fruits/vegetables, one quarter protein, and one quarter carbs (no bigger than a computer mouse)   Cut down on sweet beverages. This includes juice, soda, and sweet tea.     Drink at least 1 glass of water with each meal and aim for at least 8 glasses per day   Exercise at least 150 minutes every week.

## 2020-05-17 LAB — H. PYLORI BREATH TEST: H. pylori Breath Test: NOT DETECTED

## 2020-05-17 NOTE — Progress Notes (Signed)
Please inform patient of the following:  Breath test negative. She does not need any antibiotics but needs to follow up with GI as planned.  Katina Degree. [name], MD 05/17/2020 10:08 AM

## 2020-05-28 ENCOUNTER — Encounter: Payer: Self-pay | Admitting: Family Medicine

## 2020-05-30 MED FILL — buPROPion HCL ER (XL) 150 M: 150 | 30 days supply | Qty: 60 | Fill #5

## 2020-06-28 MED FILL — buPROPion HCL ER (XL) 150 M: 150 | 30 days supply | Qty: 60 | Fill #6

## 2020-07-12 ENCOUNTER — Encounter: Payer: Self-pay | Admitting: Family Medicine

## 2020-07-12 NOTE — Telephone Encounter (Signed)
Please advise 

## 2020-07-13 ENCOUNTER — Other Ambulatory Visit: Payer: Self-pay | Admitting: *Deleted

## 2020-07-13 DIAGNOSIS — G43809 Other migraine, not intractable, without status migrainosus: Secondary | ICD-10-CM

## 2020-07-16 ENCOUNTER — Other Ambulatory Visit (HOSPITAL_COMMUNITY): Payer: Self-pay | Admitting: Obstetrics and Gynecology

## 2020-07-16 MED FILL — ETONOGESTREL-ETHINYL ESTRAD: 0.12-0.015 | 84 days supply | Qty: 3 | Fill #0

## 2020-07-24 ENCOUNTER — Encounter: Payer: Self-pay | Admitting: Gastroenterology

## 2020-07-24 ENCOUNTER — Other Ambulatory Visit: Payer: Self-pay | Admitting: Gastroenterology

## 2020-07-24 ENCOUNTER — Ambulatory Visit (INDEPENDENT_AMBULATORY_CARE_PROVIDER_SITE_OTHER): Payer: 59 | Admitting: Gastroenterology

## 2020-07-24 VITALS — BP 98/56 | HR 63 | Ht 62.0 in | Wt 142.0 lb

## 2020-07-24 DIAGNOSIS — K219 Gastro-esophageal reflux disease without esophagitis: Secondary | ICD-10-CM | POA: Diagnosis not present

## 2020-07-24 DIAGNOSIS — R109 Unspecified abdominal pain: Secondary | ICD-10-CM

## 2020-07-24 MED ORDER — OMEPRAZOLE 40 MG PO CPDR: DELAYED_RELEASE_CAPSULE | ORAL | 11 refills | Status: DC

## 2020-07-24 MED FILL — OMEPRAZOLE 40 MG CPDR: 40 | 30 days supply | Qty: 30 | Fill #0

## 2020-07-24 NOTE — Patient Instructions (Signed)
If you are age 37 or younger, your body mass index should be between 19-25. Your Body mass index is 25.97 kg/m. If this is out of the aformentioned range listed, please consider follow up with your Primary Care Provider.   We have sent the following medications to your pharmacy for you to pick up at your convenience:  START: omeprazole 40mg  take one capsule once daily shortly before breakfast meal each day.  Please cut back on the use of NSAIDS.  You should use Tyelenol instead.  You have been scheduled for an endoscopy. Please follow written instructions given to you at your visit today. If you use inhalers (even only as needed), please bring them with you on the day of your procedure.  Due to recent changes in healthcare laws, you may see the results of your imaging and laboratory studies on MyChart before your provider has had a chance to review them.  We understand that in some cases there may be results that are confusing or concerning to you. Not all laboratory results come back in the same time frame and the provider may be waiting for multiple results in order to interpret others.  Please give 48 hours in order for your provider to thoroughly review all the results before contacting the office for clarification of your results.   Thank you for entrusting me with your care and choosing [facility].  Dr [facility]

## 2020-07-24 NOTE — Progress Notes (Signed)
HPI: This is a very pleasant 35 year old woman who was referred to me by Ardith Dark, MD  to evaluate GERD, left-sided abdominal pains.    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  For the past 6 months she has had troubles with pyrosis, left-sided abdominal pains, regurgitation of acid tasting material with belching.  She tried Carafate with good relief however this caused significant constipation which is not generally a problem for her.  She tried to regimens of omeprazole 2-week courses and after the second course that she did notice some good relief.  She has tried Pepto and Tums.  The Pepto caused her stools to be dark.  She does have intermittent nausea but she thinks this is related to her headaches.  Her weight is up 2 to 3 pounds in the past 6 months.  She does not have dysphagia.  She has no overt GI bleeding.  She takes Advil 600 mg at least every other day and naproxen 1 pill about twice a week for headache pain.  She does take Tylenol 1 pill every 2 every 3 to 4 days.  Her father was diagnosed with colon cancer in his 56s    Old Data Reviewed: Blood work: November 2021 H. pylori breath testing was negative.  September 2021 urine pregnancy test was negative, basic metabolic profile was normal.  April 2021 sedimentation rate was normal, CRP was normal, complete metabolic profile was normal    Review of systems: Pertinent positive and negative review of systems were noted in the above HPI section. All other review negative.   Past Medical History:  Diagnosis Date  . ADHD   . GERD (gastroesophageal reflux disease)   . Migraines     Past Surgical History:  Procedure Laterality Date  . BREAST BIOPSY  2012  . COLONOSCOPY  2011    Current Outpatient Medications  Medication Sig Dispense Refill  . buPROPion (WELLBUTRIN XL) 150 MG 24 hr tablet Take 2 tablets (300 mg total) by mouth daily. 7 tablet 0  .  etonogestrel-ethinyl estradiol (NUVARING) 0.12-0.015 MG/24HR vaginal ring Place 1 each vaginally every 28 (twenty-eight) days. Insert vaginally and leave in place for 3 consecutive weeks, then remove for 1 week.    Marland Kitchen ibuprofen (ADVIL) 200 MG tablet Take 200 mg by mouth every 6 (six) hours as needed.    . naproxen (NAPROSYN) 250 MG tablet Take by mouth as needed.     No current facility-administered medications for this visit.    Allergies as of 07/24/2020  . (No Known Allergies)    Family History  Problem Relation Age of Onset  . Prostate cancer Maternal Grandfather        dx in 37s  . Cancer Maternal Grandfather        colangiocarcinoma  . Breast cancer Paternal Aunt        dx in 36s  . Ovarian cancer Paternal Aunt        dx in 62s  . Uterine cancer Paternal Aunt        dx in 52s  . Colon cancer Paternal Aunt        dx in 31s  . Prostate cancer Maternal Uncle        dx in 29s-50s    Social History   Socioeconomic History  . Marital status: Married    Spouse name: Not on file  . Number of children: Not on file  .  Years of education: Not on file  . Highest education level: Not on file  Occupational History  . Not on file  Tobacco Use  . Smoking status: Never Smoker  . Smokeless tobacco: Never Used  Vaping Use  . Vaping Use: Never used  Substance and Sexual Activity  . Alcohol use: Yes  . Drug use: Never  . Sexual activity: Not on file  Other Topics Concern  . Not on file  Social History Narrative  . Not on file   Social Determinants of Health   Financial Resource Strain: Not on file  Food Insecurity: Not on file  Transportation Needs: Not on file  Physical Activity: Not on file  Stress: Not on file  Social Connections: Not on file  Intimate Partner Violence: Not on file     Physical Exam: BP (!) 98/56   Pulse 63   Ht 5\' 2"  (1.575 m)   Wt 142 lb (64.4 kg)   BMI 25.97 kg/m  Constitutional: generally well-appearing Psychiatric: alert and oriented  x3 Eyes: extraocular movements intact Mouth: oral pharynx moist, no lesions Neck: supple no lymphadenopathy Cardiovascular: heart regular rate and rhythm Lungs: clear to auscultation bilaterally Abdomen: soft, nontender, nondistended, no obvious ascites, no peritoneal signs, normal bowel sounds Extremities: no lower extremity edema bilaterally Skin: no lesions on visible extremities   Assessment and plan: 35 y.o. female with GERD, left-sided abdominal pains without alarm symptoms  I do think a lot of her symptoms are related to GERD, acid irritation of her upper GI tract.  Relative NSAID overuse also may contribute.  I recommended prescription strength proton pump inhibitor omeprazole 1 pill shortly before her breakfast meal every day.  I explained to her that she should take Tylenol 1 g once or twice a day to see if that can decrease her NSAID need.  We discussed upper endoscopy to exclude significant acid related damage or other upper GI issues, she would like to go ahead with that and we will arrange at her soonest convenience.    Please see the "Patient Instructions" section for addition details about the plan.   31, MD [facility] 07/24/2020, 10:17 AM  Cc: 07/26/2020, MD  Total time on date of encounter was 45  minutes (this included time spent preparing to see the patient reviewing records; obtaining and/or reviewing separately obtained history; performing a medically appropriate exam and/or evaluation; counseling and educating the patient and family if present; ordering medications, tests or procedures if applicable; and documenting clinical information in the health record).

## 2020-07-31 ENCOUNTER — Encounter: Payer: Self-pay | Admitting: Gastroenterology

## 2020-08-02 MED FILL — buPROPion HCL ER (XL) 150 M: 150 | 30 days supply | Qty: 60 | Fill #7

## 2020-08-03 ENCOUNTER — Ambulatory Visit (AMBULATORY_SURGERY_CENTER): Payer: 59 | Admitting: Gastroenterology

## 2020-08-03 ENCOUNTER — Encounter: Payer: Self-pay | Admitting: Gastroenterology

## 2020-08-03 ENCOUNTER — Other Ambulatory Visit: Payer: Self-pay

## 2020-08-03 VITALS — BP 104/64 | HR 78 | Temp 97.8°F | Resp 16 | Ht 62.0 in | Wt 142.0 lb

## 2020-08-03 DIAGNOSIS — R12 Heartburn: Secondary | ICD-10-CM | POA: Diagnosis not present

## 2020-08-03 DIAGNOSIS — K219 Gastro-esophageal reflux disease without esophagitis: Secondary | ICD-10-CM | POA: Diagnosis not present

## 2020-08-03 MED ORDER — SODIUM CHLORIDE 0.9 % IV SOLN: 500.0000 mL | Freq: Once | INTRAVENOUS | Status: DC

## 2020-08-03 NOTE — Op Note (Signed)
[facility] Patient Name: Rachael [name] Procedure Date: 08/03/2020 1:24 PM MRN: [id number] Endoscopist: Rachael Fee , MD Age: 35 Referring MD:  Date of Birth: 10/12/1985 Gender: Female Account #: [id number] Procedure:                Upper GI endoscopy Indications:              Abdominal pain in the left upper quadrant,                            Dyspepsia, Heartburn Medicines:                Monitored Anesthesia Care Procedure:                Pre-Anesthesia Assessment:                           - Prior to the procedure, a History and Physical                            was performed, and patient medications and                            allergies were reviewed. The patient's tolerance of                            previous anesthesia was also reviewed. The risks                            and benefits of the procedure and the sedation                            options and risks were discussed with the patient.                            All questions were answered, and informed consent                            was obtained. Prior Anticoagulants: The patient has                            taken no previous anticoagulant or antiplatelet                            agents. ASA Grade Assessment: II - A patient with                            mild systemic disease. After reviewing the risks                            and benefits, the patient was deemed in                            satisfactory condition to undergo the procedure.  After obtaining informed consent, the endoscope was                            passed under direct vision. Throughout the                            procedure, the patient's blood pressure, pulse, and                            oxygen saturations were monitored continuously. The                            Endoscope was introduced through the mouth, and                            advanced to the second part of duodenum.  The upper                            GI endoscopy was accomplished without difficulty.                            The patient tolerated the procedure well. Scope In: Scope Out: Findings:                 The esophagus was normal.                           The stomach was normal.                           The examined duodenum was normal. Complications:            No immediate complications. Estimated blood loss:                            None. Estimated Blood Loss:     Estimated blood loss: none. Impression:               - Normal UGI tract. Recommendation:           - Patient has a contact number available for                            emergencies. The signs and symptoms of potential                            delayed complications were discussed with the                            patient. Return to normal activities tomorrow.                            Written discharge instructions were provided to the                            patient.                           -  Resume previous diet.                           - Continue present medications.                           - Dr. Christella Hartigan' office will arrange a full abdominal                            [place] to continue workup for your abdominal pains. Rachael Fee, MD 08/03/2020 1:31:10 PM This report has been signed electronically.

## 2020-08-03 NOTE — Patient Instructions (Signed)
Thank you for allowing [place] to care for you today! Resume previous diet and medications today. Resume your normal daily activities tomorrow. Dr Christella Hartigan' office will arrange a full abdominal ultrasound workup for your abdominal pain.   YOU HAD AN ENDOSCOPIC PROCEDURE TODAY AT THE [facility]:   Refer to the procedure report that was given to you for any specific questions about what was found during the examination.  If the procedure report does not answer your questions, please call your gastroenterologist to clarify.  If you requested that your care partner not be given the details of your procedure findings, then the procedure report has been included in a sealed envelope for you to review at your convenience later.  YOU SHOULD EXPECT: Some feelings of bloating in the abdomen. Passage of more gas than usual.  Walking can help get rid of the air that was put into your GI tract during the procedure and reduce the bloating. If you had a lower endoscopy (such as a colonoscopy or flexible sigmoidoscopy) you may notice spotting of blood in your stool or on the toilet paper. If you underwent a bowel prep for your procedure, you may not have a normal bowel movement for a few days.  Please Note:  You might notice some irritation and congestion in your nose or some drainage.  This is from the oxygen used during your procedure.  There is no need for concern and it should clear up in a day or so.  SYMPTOMS TO REPORT IMMEDIATELY:     Following upper endoscopy (EGD)  Vomiting of blood or coffee ground material  New chest pain or pain under the shoulder blades  Painful or persistently difficult swallowing  New shortness of breath  Fever of 100F or higher  Black, tarry-looking stools  For urgent or emergent issues, a gastroenterologist can be reached at any hour by calling [phone]. Do not use MyChart messaging for urgent concerns.    DIET:  We do recommend a small meal at first, but  then you may proceed to your regular diet.  Drink plenty of fluids but you should avoid alcoholic beverages for 24 hours.  ACTIVITY:  You should plan to take it easy for the rest of today and you should NOT DRIVE or use heavy machinery until tomorrow (because of the sedation medicines used during the test).    FOLLOW UP: Our staff will call the number listed on your records 48-72 hours following your procedure to check on you and address any questions or concerns that you may have regarding the information given to you following your procedure. If we do not reach you, we will leave a message.  We will attempt to reach you two times.  During this call, we will ask if you have developed any symptoms of COVID 19. If you develop any symptoms (ie: fever, flu-like symptoms, shortness of breath, cough etc.) before then, please call [phone].  If you test positive for Covid 19 in the 2 weeks post procedure, please call and report this information to [place].    If any biopsies were taken you will be contacted by phone or by letter within the next 1-3 weeks.  Please call us at [phone] if you have not heard about the biopsies in 3 weeks.    SIGNATURES/CONFIDENTIALITY: You and/or your care partner have signed paperwork which will be entered into your electronic medical record.  These signatures attest to the fact that that the information above on  your After Visit Summary has been reviewed and is understood.  Full responsibility of the confidentiality of this discharge information lies with you and/or your care-partner. 

## 2020-08-07 ENCOUNTER — Telehealth: Payer: Self-pay | Admitting: *Deleted

## 2020-08-07 ENCOUNTER — Telehealth: Payer: Self-pay

## 2020-08-07 NOTE — Telephone Encounter (Signed)
  Follow up Call-  Call back number 08/03/2020  Post procedure Call Back phone  # [phone]  Permission to leave phone message Yes  Some recent data might be hidden     Patient questions:  Do you have a fever, pain , or abdominal swelling? No. Pain Score  0 *  Have you tolerated food without any problems? Yes.    Have you been able to return to your normal activities? Yes.    Do you have any questions about your discharge instructions: Diet   No. Medications  No. Follow up visit  No.  Do you have questions or concerns about your Care? No.  Actions: * If pain score is 4 or above: No action needed, pain <4.  1. Have you developed a fever since your procedure? no  2.   Have you had an respiratory symptoms (SOB or cough) since your procedure? no  3.   Have you tested positive for COVID 19 since your procedure no  4.   Have you had any family members/close contacts diagnosed with the COVID 19 since your procedure?  no   If yes to any of these questions please route to Laverna Peace, RN and Karlton Lemon, RN

## 2020-08-07 NOTE — Telephone Encounter (Signed)
Gunnar Fusi from the headache clinic stated she has received a referral for patient and did not receive any appt notes, or any MRI results or anything to do with her headaches, can we please send them all necessary information and notes, Patient is scheduled MARCH 15th at the headache clinic

## 2020-08-08 ENCOUNTER — Telehealth: Payer: Self-pay

## 2020-08-08 DIAGNOSIS — R109 Unspecified abdominal pain: Secondary | ICD-10-CM

## 2020-08-08 NOTE — Telephone Encounter (Signed)
Per the 1/28 procedure report;  Dr. Christella Hartigan' office will arrange a full abdominal US to continue workup for your abdominal pains.

## 2020-08-08 NOTE — Telephone Encounter (Signed)
The pt has been advised of the appt and all information has been sent to the pt My Chart.    You have been scheduled for an abdominal ultrasound at [facility] (1st floor of hospital) on 2/9/222 at 930 am. Please arrive 15 minutes prior to your appointment for registration. Make certain not to have anything to eat or drink 6 hours prior to your appointment. Should you need to reschedule your appointment, please contact radiology at [phone]. This test typically takes about 30 minutes to perform.

## 2020-08-15 ENCOUNTER — Ambulatory Visit (HOSPITAL_COMMUNITY): Payer: 59

## 2020-08-15 ENCOUNTER — Telehealth: Payer: Self-pay | Admitting: Gastroenterology

## 2020-08-15 NOTE — Telephone Encounter (Signed)
See below

## 2020-08-15 NOTE — Telephone Encounter (Signed)
Patient is calling to reschedule CT appointment

## 2020-08-15 NOTE — Telephone Encounter (Signed)
Error Ultrasound NOT CT

## 2020-08-15 NOTE — Telephone Encounter (Signed)
The pt has been given the phone number to [facility] radiology so that she can reschedule her appt for the CT.

## 2020-08-22 ENCOUNTER — Ambulatory Visit (HOSPITAL_COMMUNITY)
Admission: RE | Admit: 2020-08-22 | Discharge: 2020-08-22 | Disposition: A | Discharge status: Home / Self Care | Payer: 59 | Source: Ambulatory Visit | Attending: Gastroenterology | Admitting: Gastroenterology

## 2020-08-22 ENCOUNTER — Other Ambulatory Visit: Payer: Self-pay

## 2020-08-22 DIAGNOSIS — R109 Unspecified abdominal pain: Secondary | ICD-10-CM | POA: Insufficient documentation

## 2020-08-31 MED FILL — OMEPRAZOLE 40 MG CPDR: 40 | 30 days supply | Qty: 30 | Fill #1

## 2020-09-05 ENCOUNTER — Other Ambulatory Visit (HOSPITAL_COMMUNITY): Payer: Self-pay | Admitting: Obstetrics and Gynecology

## 2020-09-05 DIAGNOSIS — Z6825 Body mass index (BMI) 25.0-25.9, adult: Secondary | ICD-10-CM | POA: Diagnosis not present

## 2020-09-05 DIAGNOSIS — Z760 Encounter for issue of repeat prescription: Secondary | ICD-10-CM | POA: Diagnosis not present

## 2020-09-05 DIAGNOSIS — Z01419 Encounter for gynecological examination (general) (routine) without abnormal findings: Secondary | ICD-10-CM | POA: Diagnosis not present

## 2020-09-07 MED FILL — buPROPion HCL ER (XL) 150 M: 150 | 30 days supply | Qty: 60 | Fill #8

## 2020-09-27 NOTE — Telephone Encounter (Signed)
I have faxed trough [facility] all of the notes we have for the last year for this pt

## 2020-10-06 ENCOUNTER — Other Ambulatory Visit (HOSPITAL_COMMUNITY): Payer: Self-pay

## 2020-10-10 ENCOUNTER — Other Ambulatory Visit (HOSPITAL_COMMUNITY): Payer: Self-pay

## 2020-10-10 MED FILL — Etonogestrel-Ethinyl Estradiol VA Ring 0.12-0.015 MG/24HR: VAGINAL | 84 days supply | Qty: 3 | Fill #0 | Status: AC

## 2020-11-08 ENCOUNTER — Ambulatory Visit: Payer: 59 | Admitting: Family Medicine

## 2020-11-08 ENCOUNTER — Other Ambulatory Visit: Payer: Self-pay

## 2020-11-08 ENCOUNTER — Ambulatory Visit (INDEPENDENT_AMBULATORY_CARE_PROVIDER_SITE_OTHER): Payer: 59

## 2020-11-08 ENCOUNTER — Encounter: Payer: Self-pay | Admitting: Family Medicine

## 2020-11-08 ENCOUNTER — Other Ambulatory Visit (HOSPITAL_COMMUNITY): Payer: Self-pay

## 2020-11-08 VITALS — BP 101/68 | HR 65 | Temp 98.1°F | Ht 62.0 in | Wt 141.4 lb

## 2020-11-08 DIAGNOSIS — G43809 Other migraine, not intractable, without status migrainosus: Secondary | ICD-10-CM

## 2020-11-08 DIAGNOSIS — M545 Low back pain, unspecified: Secondary | ICD-10-CM | POA: Diagnosis not present

## 2020-11-08 DIAGNOSIS — M791 Myalgia, unspecified site: Secondary | ICD-10-CM

## 2020-11-08 DIAGNOSIS — L659 Nonscarring hair loss, unspecified: Secondary | ICD-10-CM | POA: Diagnosis not present

## 2020-11-08 MED ORDER — CYCLOBENZAPRINE HCL 10 MG PO TABS
10.0000 mg | ORAL_TABLET | Freq: Three times a day (TID) | ORAL | 0 refills | Status: DC | PRN
  Filled 2020-11-08: qty 30, 10d supply, fill #0

## 2020-11-08 MED ORDER — DICLOFENAC SODIUM 75 MG PO TBEC
75.0000 mg | DELAYED_RELEASE_TABLET | Freq: Two times a day (BID) | ORAL | 0 refills | Status: DC
Start: 2020-11-08 — End: 2020-12-27
  Filled 2020-11-08: qty 30, 15d supply, fill #0

## 2020-11-08 NOTE — Patient Instructions (Signed)
It was very nice to see you today!  We believe you have a muscular strain in your lower back.  You could have some nerve impingement due to this.  Please start the Flexeril and diclofenac.  We will check an x-ray today and refer you to see a physical therapist.  I will also refer you to see a dermatologist and neurologist.  Take care, Dr [name]  PLEASE NOTE:  If you had any lab tests please let us know if you have not heard back within a few days. You may see your results on mychart before we have a chance to review them but we will give you a call once they are reviewed by [place]. If we ordered any referrals today, please let us know if you have not heard from their office within the next week.   Please try these tips to maintain a healthy lifestyle:   Eat at least 3 REAL meals and 1-2 snacks per day.  Aim for no more than 5 hours between eating.  If you eat breakfast, please do so within one hour of getting up.    Each meal should contain half fruits/vegetables, one quarter protein, and one quarter carbs (no bigger than a computer mouse)   Cut down on sweet beverages. This includes juice, soda, and sweet tea.     Drink at least 1 glass of water with each meal and aim for at least 8 glasses per day   Exercise at least 150 minutes every week.

## 2020-11-08 NOTE — Assessment & Plan Note (Signed)
Will place referral to dermatology.  She can use topical minoxidil.

## 2020-11-08 NOTE — Progress Notes (Signed)
   Rachael [name] is a 35 y.o. female who presents today for an office visit.  Assessment/Plan:  New/Acute Problems: Low back pain No red flags.  Likely muscular strain though given persistent nature for the last several weeks we will check x-ray today.  We will start diclofenac and Flexeril.  Also place referral to see physical therapy.  May need referral to sports medicine if not improved with above.  Chronic Problems Addressed Today: Alopecia Will place referral to dermatology.  She can use topical minoxidil.  Migraines Still persistent and not well controlled.  Will place referral to neurology for further management per patient request.    Subjective:  HPI:  Patient with a few issues that she would like to discuss today.  She has been dealing with worsening chronic low back pain for the last month.  No obvious injuries or precipitating events.  Located in right lower back.  She has tried several home remedies including heat, exercises, Tylenol, and ibuprofen.  No significant treatment.  Pain radiates into right thigh and right knee.  She is also had issues with ongoing hair loss for the last several months.  She is now losing several large clumps of hair.  No specific treatments tried.  She has also had ongoing issues with migraines.  We referred her to the headache clinic but she has not been able be seen there.  She will to see a neurologist.       Objective:  Physical Exam: BP 101/68   Pulse 65   Temp 98.1 F (36.7 C) (Temporal)   Ht 5\' 2"  (1.575 m)   Wt 141 lb 6.4 oz (64.1 kg)   LMP 10/21/2020   SpO2 98%   BMI 25.86 kg/m   Gen: No acute distress, resting comfortably CV: Regular rate and rhythm with no murmurs appreciated Pulm: Normal work of breathing, clear to auscultation bilaterally with no crackles, wheezes, or rhonchi MSK: Back without deformities.  Tender to palpation along right lower lumbar paraspinal muscle group. Neuro: Grossly normal, moves all  extremities Psych: Normal affect and thought content      Wally Shevchenko M. 10/23/2020, MD 11/08/2020 9:29 AM

## 2020-11-08 NOTE — Assessment & Plan Note (Signed)
Still persistent and not well controlled.  Will place referral to neurology for further management per patient request.

## 2020-11-12 ENCOUNTER — Telehealth: Payer: Self-pay | Admitting: Dermatology

## 2020-11-12 NOTE — Telephone Encounter (Signed)
Referral attached to appointment

## 2020-11-12 NOTE — Telephone Encounter (Signed)
Patient is calling for a referral appointment from Jacquiline Doe, MD.  Patient is scheduled for 04/24/2021 at 2:45 with Janalyn Harder, MD.

## 2020-11-12 NOTE — Progress Notes (Signed)
Please inform patient of the following:  Her xray is normal. Would like for her to let us know if her symptoms are not improving.  Katina Degree. [name], MD 11/12/2020 8:22 AM

## 2020-11-15 ENCOUNTER — Other Ambulatory Visit: Payer: Self-pay | Admitting: *Deleted

## 2020-11-15 ENCOUNTER — Other Ambulatory Visit (HOSPITAL_COMMUNITY): Payer: Self-pay

## 2020-11-15 ENCOUNTER — Encounter: Payer: Self-pay | Admitting: Family Medicine

## 2020-11-15 MED ORDER — BUPROPION HCL ER (XL) 150 MG PO TB24
2.0000 | ORAL_TABLET | Freq: Every day | ORAL | 3 refills | Status: DC
  Filled 2020-11-15: qty 60, 30d supply, fill #0
  Filled 2021-01-18: qty 60, 30d supply, fill #1
  Filled 2021-02-21: qty 60, 30d supply, fill #2
  Filled 2021-04-16: qty 60, 30d supply, fill #3

## 2020-11-22 ENCOUNTER — Other Ambulatory Visit: Payer: Self-pay

## 2020-11-22 ENCOUNTER — Ambulatory Visit: Payer: 59 | Admitting: Physical Therapy

## 2020-11-22 ENCOUNTER — Encounter: Payer: Self-pay | Admitting: Physical Therapy

## 2020-11-22 DIAGNOSIS — M25551 Pain in right hip: Secondary | ICD-10-CM

## 2020-11-22 DIAGNOSIS — M5441 Lumbago with sciatica, right side: Secondary | ICD-10-CM | POA: Diagnosis not present

## 2020-11-22 NOTE — Therapy (Signed)
[facility] [place] [facility] [address] [place], [place], [address] Phone: [phone]   Fax:  [phone]  Physical Therapy Evaluation  Patient Details  Name: Rachael [name] MRN: [id number] Date of Birth: 09/09/1985 Referring Provider (PT): Jacquiline Doe   Encounter Date: 11/22/2020   PT End of Session - 11/22/20 1500    Visit Number 1    Number of Visits 12    Date for PT Re-Evaluation 01/03/21    Authorization Type [facility]: UMR    PT Start Time 0852    PT Stop Time 0930    PT Time Calculation (min) 38 min    Activity Tolerance Patient tolerated treatment well;Patient limited by pain    Behavior During Therapy [facility] for tasks assessed/performed           Past Medical History:  Diagnosis Date  . ADHD   . GERD (gastroesophageal reflux disease)   . Migraines     Past Surgical History:  Procedure Laterality Date  . BREAST BIOPSY  2012  . COLONOSCOPY  2011    There were no vitals filed for this visit.    Subjective Assessment - 11/22/20 1502    Subjective Pt states ongoing pain in R back and hip. States most pain in anterior hip, becoming more constant. Pain in hip and thigh, and up into R low back. States much difficulty with sitting. 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 Used to be very active, likes exercising, is frustrated because she has not been able to do this, due to increasing pain.    Patient Stated Goals Neg lumbar x-ray    Currently in Pain? Yes    Pain Score 5     Pain Location Hip    Pain Orientation Right    Pain Descriptors / Indicators Aching;Shooting;Tightness    Pain Type Acute pain    Pain Onset More than a month ago    Pain Frequency Intermittent    Aggravating Factors  sitting, standing, squatting, walking    Pain Relieving Factors hip flexor stretching              [facility] PT Assessment - 11/22/20 0001      Assessment   Medical Diagnosis R Low back pain    Referring Provider (PT) Jacquiline Doe    Hand Dominance Left    Prior Therapy no      Precautions   Precautions None      Balance Screen   Has the patient fallen in the past 6 months No      Prior Function   Level of Independence Independent      Cognition   Overall Cognitive Status Within Functional Limits for tasks assessed      Posture/Postural Control   Posture Comments Posteriorly: mild shift to L; ASIS, pelvic crests appear even      ROM / Strength   AROM / PROM / Strength AROM;Strength      AROM   Overall AROM Comments Hip ROM: WNL, significant pain wtih flexion, mild pain with IR/ER    AROM Assessment Site Lumbar    Lumbar Flexion wnl    Lumbar Extension mild limitation/pain    Lumbar - Right Side Bend WNL    Lumbar - Left Side Bend WNL      Strength   Overall Strength Comments Hips: Flexion: 4/5, pain;  abd: 4+/5      Palpation   Palpation comment Mild pain at R ASIS, Mild tightness  in R lumbar paraspinals, Pain into R SI,      Special Tests   Other special tests Pain in anterior hip with hip flexion (active and passive); Trial for lumbar extension: increased pain in R low back; Painful SI compression on R; Neg SLR,                      Objective measurements completed on examination: See above findings.       [facility] Adult PT Treatment/Exercise - 11/22/20 0001      Exercises   Exercises Lumbar      Lumbar Exercises: Stretches   Pelvic Tilt 10 reps    Other Lumbar Stretch Exercise Hip flexor: kneeling 30 sec x 2      Lumbar Exercises: Standing   Other Standing Lumbar Exercises standing extension x 10;  Side glides to L x 15;                  PT Education - 11/22/20 1458    Education Details PT POC, Exam findings, HEP    Person(s) Educated Patient    Methods Explanation;Demonstration;Tactile cues;Verbal cues;Handout    Comprehension Verbalized understanding;Returned demonstration;Verbal cues required;Need further instruction;Tactile cues required             PT Short Term Goals - 11/22/20 1514      PT SHORT TERM GOAL #1   Title Pt to be independent with initial HEP    Time 2    Period Weeks    Status New    Target Date 12/06/20      PT SHORT TERM GOAL #2   Title Pt to report decreased pain to 2-3/10 in hip and back    Time 2    Period Weeks    Status New    Target Date 12/06/20             PT Long Term Goals - 11/22/20 1515      PT LONG TERM GOAL #1   Title Pt to be independent with final HEP    Time 6    Period Weeks    Status New    Target Date 01/03/21      PT LONG TERM GOAL #2   Title Pt to report decreased pain in hip and low back to 0-2/10 with sitting, bending, walking, to improve abilty for work duties    Time 6    Period Weeks    Status New    Target Date 01/03/21      PT LONG TERM GOAL #3   Title Pt to report ability for exercise, for at least 30 min, without pain greater than 2/10    Time 6    Period Weeks    Status New    Target Date 01/03/21      PT LONG TERM GOAL #4   Title Pt to demo improved ROM for hip and lumbar, to be WNL, and pain free, to improve ability for IADLS and functional activities.    Time 6    Period Weeks    Status New    Target Date 01/03/21                  Plan - 11/22/20 1505    Clinical Impression Statement Pt presents with primary complaint of increased pain in R hip and low back. Pt with most pain in R anterior hip, R low back, SI, and into R thigh. Pt with continued pain in  back, with lumbar extension today, and did not have relief of anterior hip pain with distraction and mobilization today. Pt with much discomfort with postitioning, sitting, standing, unable to get comfortable. She has been unable to exercise or perform regular functional activites due to pain. Pt does not appear to have pelvic rotation, but pain location and reports would suggest this. Will continue to test and asses for pain origin from back vs hip, vs hip labrum. Pt to benefit from skilled  PT to improve deficits, pain, and return to PLOF.    Personal Factors and Comorbidities Time since onset of injury/illness/exacerbation    Examination-Activity Limitations Bend;Sit;Sleep;Carry;Squat;Stairs;Stand;Lift;Locomotion Level    Examination-Participation Restrictions Meal Prep;Cleaning;Occupation;Community Activity;Yard Work;Laundry;Shop;Driving    Stability/Clinical Decision Making Evolving/Moderate complexity    Clinical Decision Making Moderate    Rehab Potential Good    PT Frequency 2x / week    PT Duration 6 weeks    PT Treatment/Interventions ADLs/Self Care Home Management;Cryotherapy;Electrical Stimulation;Gait training;DME Instruction;Ultrasound;Traction;Moist Heat;Iontophoresis 4mg /ml Dexamethasone;Stair training;Functional mobility training;Therapeutic activities;Therapeutic exercise;Balance training;Neuromuscular re-education;Patient/family education;Manual techniques;Taping;Dry needling;Passive range of motion;Vasopneumatic Device;Spinal Manipulations;Joint Manipulations    PT Home Exercise Plan [email or web address]    Consulted and Agree with Plan of Care Patient           Patient will benefit from skilled therapeutic intervention in order to improve the following deficits and impairments:  Decreased activity tolerance,Increased muscle spasms,Pain,Impaired flexibility,Decreased strength,Decreased mobility  Visit Diagnosis: Acute right-sided low back pain with right-sided sciatica  Pain in right hip     Problem List Patient Active Problem List   Diagnosis Date Noted  . Alopecia 11/08/2020  . GERD (gastroesophageal reflux disease) 05/16/2020  . ADHD 10/12/2019  . Migraines 10/12/2019  . Myalgia 10/12/2019  . Vitamin D deficiency 10/12/2019  . Family history of colon cancer 10/12/2019    12/12/2019, PT, DPT 3:19 PM  11/22/20     [facility] [address] [place], [place], [place] Phone: [phone]   Fax:   [phone]  Name: ELLIOTT QUADE MRN: Pietro Cassis Date of Birth: 29-Sep-1985

## 2020-11-22 NOTE — Patient Instructions (Signed)
Access Code: [email or web address] URL: [email or web address] Date: 11/22/2020 Prepared by: Sedalia Muta  Exercises Supine Posterior Pelvic Tilt - 2 x daily - 1 sets - 10 reps Half Kneeling Hip Flexor Stretch - 2 x daily - 3 reps - 30 hold Standing Lumbar Extension - 2 x daily - 1 sets - 10 reps Left Standing Lateral Shift Correction at Wall - Repetitions - 2 x daily - 1 sets - 10 reps

## 2020-11-26 ENCOUNTER — Ambulatory Visit: Payer: 59 | Admitting: Physical Therapy

## 2020-11-26 ENCOUNTER — Other Ambulatory Visit: Payer: Self-pay

## 2020-11-26 ENCOUNTER — Encounter: Payer: Self-pay | Admitting: Physical Therapy

## 2020-11-26 DIAGNOSIS — M5441 Lumbago with sciatica, right side: Secondary | ICD-10-CM

## 2020-11-26 DIAGNOSIS — M25551 Pain in right hip: Secondary | ICD-10-CM

## 2020-11-27 ENCOUNTER — Ambulatory Visit: Payer: 59 | Admitting: Physical Therapy

## 2020-11-27 ENCOUNTER — Encounter: Payer: Self-pay | Admitting: Physical Therapy

## 2020-11-27 DIAGNOSIS — M5441 Lumbago with sciatica, right side: Secondary | ICD-10-CM

## 2020-11-27 DIAGNOSIS — M25551 Pain in right hip: Secondary | ICD-10-CM | POA: Diagnosis not present

## 2020-11-27 NOTE — Therapy (Signed)
[facility] [place] [facility] [address] [place], [place], [address] Phone: [phone]   Fax:  [phone]  Physical Therapy Treatment  Patient Details  Name: Rachael [name] MRN: [id number] Date of Birth: 12-Nov-1985 Referring Provider (PT): Jacquiline Doe   Encounter Date: 11/27/2020   PT End of Session - 11/27/20 1143    Visit Number 3    Number of Visits 12    Date for PT Re-Evaluation 01/03/21    Authorization Type [facility]: UMR    PT Start Time 1010    PT Stop Time 1056    PT Time Calculation (min) 46 min    Activity Tolerance Patient tolerated treatment well;Patient limited by pain    Behavior During Therapy [facility] for tasks assessed/performed           Past Medical History:  Diagnosis Date  . ADHD   . GERD (gastroesophageal reflux disease)   . Migraines     Past Surgical History:  Procedure Laterality Date  . BREAST BIOPSY  2012  . COLONOSCOPY  2011    There were no vitals filed for this visit.   Subjective Assessment - 11/27/20 1137    Subjective Pt states soreness in R SI/low back region after treatment yesterday. She has increased pain in groin today    Currently in Pain? Yes    Pain Score 5     Pain Location Hip    Pain Orientation Right    Pain Descriptors / Indicators Aching;Shooting    Pain Type Acute pain    Pain Onset More than a month ago    Pain Frequency Intermittent                             [facility] Adult PT Treatment/Exercise - 11/27/20 1018      Posture/Postural Control   Posture Comments Posteriorly: mild shift to L; ASIS, pelvic crests appear even      Exercises   Exercises Lumbar      Lumbar Exercises: Stretches   Pelvic Tilt --    Other Lumbar Stretch Exercise Hip flexor: kneeling 30 sec x 2      Lumbar Exercises: Aerobic   Recumbent Bike L1 x 5 min;      Lumbar Exercises: Supine   Clam 20 reps    Clam Limitations GTB    Bridge with clamshell 20 reps      Lumbar Exercises:  Sidelying   Hip Abduction 10 reps;Right      Lumbar Exercises: Quadruped   Opposite Arm/Leg Raise 20 reps      Manual Therapy   Manual Therapy Joint mobilization;Soft tissue mobilization;Manual Traction;Passive ROM    Manual therapy comments skilled palpation and monitoring of soft tissue with dry needling    Joint Mobilization --    Soft tissue mobilization DTM and TPR to R glute med, min, piriformis,    Manual Traction --            Trigger Point Dry Needling - 11/27/20 0001    Consent Given? Yes    Education Handout Provided Yes    Muscles Treated Back/Hip Gluteus minimus;Gluteus medius;Piriformis    Gluteus Minimus Response Palpable increased muscle length   R   Gluteus Medius Response Palpable increased muscle length   R   Piriformis Response Palpable increased muscle length   R                 PT Short Term  Goals - 11/22/20 1514      PT SHORT TERM GOAL #1   Title Pt to be independent with initial HEP    Time 2    Period Weeks    Status New    Target Date 12/06/20      PT SHORT TERM GOAL #2   Title Pt to report decreased pain to 2-3/10 in hip and back    Time 2    Period Weeks    Status New    Target Date 12/06/20             PT Long Term Goals - 11/22/20 1515      PT LONG TERM GOAL #1   Title Pt to be independent with final HEP    Time 6    Period Weeks    Status New    Target Date 01/03/21      PT LONG TERM GOAL #2   Title Pt to report decreased pain in hip and low back to 0-2/10 with sitting, bending, walking, to improve abilty for work duties    Time 6    Period Weeks    Status New    Target Date 01/03/21      PT LONG TERM GOAL #3   Title Pt to report ability for exercise, for at least 30 min, without pain greater than 2/10    Time 6    Period Weeks    Status New    Target Date 01/03/21      PT LONG TERM GOAL #4   Title Pt to demo improved ROM for hip and lumbar, to be WNL, and pain free, to improve ability for IADLS and  functional activities.    Time 6    Period Weeks    Status New    Target Date 01/03/21                 Plan - 11/27/20 1144    Clinical Impression Statement Pt with ability for ther ex today, without increased pain during session. She does have anterior hip pain deep in groin today, that is mostly unchanged. Anterior pain has not been much improved wtih stretching or mobilization. Dry needling done today for tightness/soreness in glute musculature. Discussed recommendation for referral to sports med, for a pelvic manipulation, for pain relief, as well as possiblility of labral involvement due to type and location of pain. Pt able to get visit with sports med, but not until about 4 weeks. She will call to see if she can get in earlier.    Personal Factors and Comorbidities Time since onset of injury/illness/exacerbation    Examination-Activity Limitations Bend;Sit;Sleep;Carry;Squat;Stairs;Stand;Lift;Locomotion Level    Examination-Participation Restrictions Meal Prep;Cleaning;Occupation;Community Activity;Yard Work;Laundry;Shop;Driving    Stability/Clinical Decision Making Evolving/Moderate complexity    Rehab Potential Good    PT Frequency 2x / week    PT Duration 6 weeks    PT Treatment/Interventions ADLs/Self Care Home Management;Cryotherapy;Electrical Stimulation;Gait training;DME Instruction;Ultrasound;Traction;Moist Heat;Iontophoresis 4mg /ml Dexamethasone;Stair training;Functional mobility training;Therapeutic activities;Therapeutic exercise;Balance training;Neuromuscular re-education;Patient/family education;Manual techniques;Taping;Dry needling;Passive range of motion;Vasopneumatic Device;Spinal Manipulations;Joint Manipulations    PT Home Exercise Plan [email or web address]    Consulted and Agree with Plan of Care Patient           Patient will benefit from skilled therapeutic intervention in order to improve the following deficits and impairments:  Decreased activity tolerance,Increased  muscle spasms,Pain,Impaired flexibility,Decreased strength,Decreased mobility  Visit Diagnosis: Acute right-sided low back pain with right-sided sciatica  Pain in right hip  Problem List Patient Active Problem List   Diagnosis Date Noted  . Alopecia 11/08/2020  . GERD (gastroesophageal reflux disease) 05/16/2020  . ADHD 10/12/2019  . Migraines 10/12/2019  . Myalgia 10/12/2019  . Vitamin D deficiency 10/12/2019  . Family history of colon cancer 10/12/2019    Sedalia Muta, PT, DPT 11:47 AM  11/27/20    [facility] [address] [place], [place], [address] Phone: [phone]   Fax:  [phone]  Name: Rachael [name] MRN: [id number] Date of Birth: [birth year]

## 2020-11-27 NOTE — Therapy (Signed)
[facility] [place] [facility] [address] [place], [place], [address] Phone: [phone]   Fax:  [phone]  Physical Therapy Treatment  Patient Details  Name: Rachael [name] MRN: [id number] Date of Birth: [birth year] Referring Provider (PT): Jacquiline Doe   Encounter Date: 11/26/2020   PT End of Session - 11/27/20 0959    Visit Number 2    Number of Visits 12    Date for PT Re-Evaluation 01/03/21    Authorization Type [facility]: UMR    PT Start Time 1550    PT Stop Time 1630    PT Time Calculation (min) 40 min    Activity Tolerance Patient tolerated treatment well;Patient limited by pain    Behavior During Therapy [facility] for tasks assessed/performed           Past Medical History:  Diagnosis Date  . ADHD   . GERD (gastroesophageal reflux disease)   . Migraines     Past Surgical History:  Procedure Laterality Date  . BREAST BIOPSY  2012  . COLONOSCOPY  2011    There were no vitals filed for this visit.   Subjective Assessment - 11/26/20 1549    Subjective Pt states not much improvement with pain. Hip not as sore today as last visit. Has been doing HEP daily.    Patient Stated Goals Neg lumbar x-ray    Currently in Pain? Yes    Pain Score 5     Pain Location Hip    Pain Orientation Right    Pain Descriptors / Indicators Aching;Shooting;Sore    Pain Type Acute pain    Pain Onset More than a month ago    Pain Frequency Intermittent                             [facility] Adult PT Treatment/Exercise - 11/27/20 0001      Posture/Postural Control   Posture Comments Posteriorly: mild shift to L; ASIS, pelvic crests appear even      Exercises   Exercises Lumbar      Lumbar Exercises: Stretches   Pelvic Tilt 20 reps      Lumbar Exercises: Supine   Clam 20 reps    Clam Limitations GTB    Bent Knee Raise 20 reps    Bent Knee Raise Limitations GTB    Bridge with clamshell 20 reps      Lumbar Exercises: Sidelying   Hip  Abduction 10 reps;Right      Manual Therapy   Manual Therapy Joint mobilization;Soft tissue mobilization;Manual Traction;Passive ROM    Joint Mobilization HIp inf and post mobs gr 3;    Soft tissue mobilization DTM and TPR to R glute med, min, piriformis,    Manual Traction LOng leg distraction on R, for hip pump                  PT Education - 11/27/20 0959    Education Details Updated HEP    Person(s) Educated Patient    Methods Explanation;Demonstration;Tactile cues;Verbal cues;Handout    Comprehension Verbalized understanding;Verbal cues required;Returned demonstration;Tactile cues required;Need further instruction            PT Short Term Goals - 11/22/20 1514      PT SHORT TERM GOAL #1   Title Pt to be independent with initial HEP    Time 2    Period Weeks    Status New    Target Date 12/06/20  PT SHORT TERM GOAL #2   Title Pt to report decreased pain to 2-3/10 in hip and back    Time 2    Period Weeks    Status New    Target Date 12/06/20             PT Long Term Goals - 11/22/20 1515      PT LONG TERM GOAL #1   Title Pt to be independent with final HEP    Time 6    Period Weeks    Status New    Target Date 01/03/21      PT LONG TERM GOAL #2   Title Pt to report decreased pain in hip and low back to 0-2/10 with sitting, bending, walking, to improve abilty for work duties    Time 6    Period Weeks    Status New    Target Date 01/03/21      PT LONG TERM GOAL #3   Title Pt to report ability for exercise, for at least 30 min, without pain greater than 2/10    Time 6    Period Weeks    Status New    Target Date 01/03/21      PT LONG TERM GOAL #4   Title Pt to demo improved ROM for hip and lumbar, to be WNL, and pain free, to improve ability for IADLS and functional activities.    Time 6    Period Weeks    Status New    Target Date 01/03/21                 Plan - 11/27/20 1001    Clinical Impression Statement Pt able to  perform light strengthening in neutral positions today without increased pain. Improved ability and tolerance for movement from Evaluation. Continues to have anterior hip pain with hip flexion, but most pain located in glute today. DTM done to improve muscle tension and pain, pt to benefit from dry needling in future sessions. Noted improvment in ability and pain with lumbar extension since last visit as well. Plan to progress strength as tolerated and continue manual for pain .    Personal Factors and Comorbidities Time since onset of injury/illness/exacerbation    Examination-Activity Limitations Bend;Sit;Sleep;Carry;Squat;Stairs;Stand;Lift;Locomotion Level    Examination-Participation Restrictions Meal Prep;Cleaning;Occupation;Community Activity;Yard Work;Laundry;Shop;Driving    Stability/Clinical Decision Making Evolving/Moderate complexity    Rehab Potential Good    PT Frequency 2x / week    PT Duration 6 weeks    PT Treatment/Interventions ADLs/Self Care Home Management;Cryotherapy;Electrical Stimulation;Gait training;DME Instruction;Ultrasound;Traction;Moist Heat;Iontophoresis 4mg /ml Dexamethasone;Stair training;Functional mobility training;Therapeutic activities;Therapeutic exercise;Balance training;Neuromuscular re-education;Patient/family education;Manual techniques;Taping;Dry needling;Passive range of motion;Vasopneumatic Device;Spinal Manipulations;Joint Manipulations    PT Home Exercise Plan [email or web address]    Consulted and Agree with Plan of Care Patient           Patient will benefit from skilled therapeutic intervention in order to improve the following deficits and impairments:  Decreased activity tolerance,Increased muscle spasms,Pain,Impaired flexibility,Decreased strength,Decreased mobility  Visit Diagnosis: Acute right-sided low back pain with right-sided sciatica  Pain in right hip     Problem List Patient Active Problem List   Diagnosis Date Noted  . Alopecia 11/08/2020   . GERD (gastroesophageal reflux disease) 05/16/2020  . ADHD 10/12/2019  . Migraines 10/12/2019  . Myalgia 10/12/2019  . Vitamin D deficiency 10/12/2019  . Family history of colon cancer 10/12/2019    12/12/2019, PT, DPT 10:05 AM  11/27/20    [facility]  [address] [place], [place], [address] Phone: [phone]   Fax:  [phone]  Name: KESS MCILWAIN MRN: [id number] Date of Birth: [birth year]

## 2020-12-01 ENCOUNTER — Telehealth: Payer: 59 | Admitting: Orthopedic Surgery

## 2020-12-01 DIAGNOSIS — U071 COVID-19: Secondary | ICD-10-CM

## 2020-12-01 MED ORDER — BENZONATATE 100 MG PO CAPS: 100.0000 mg | ORAL_CAPSULE | Freq: Three times a day (TID) | ORAL | 0 refills | Status: DC | PRN

## 2020-12-01 MED ORDER — ALBUTEROL SULFATE HFA 108 (90 BASE) MCG/ACT IN AERS: 2.0000 | INHALATION_SPRAY | Freq: Four times a day (QID) | RESPIRATORY_TRACT | 0 refills | Status: DC | PRN

## 2020-12-01 NOTE — Progress Notes (Signed)
This was a duplicate encounter. Please disregard.

## 2020-12-01 NOTE — Progress Notes (Signed)
E-Visit for Positive Covid Test Result We are sorry you are not feeling well. We are here to help!  You have tested positive for COVID-19, meaning that you were infected with the novel coronavirus and could give the virus to others.  It is vitally important that you stay home so you do not spread it to others.      Please continue isolation at home, for at least 10 days since the start of your symptoms and until you have had 24 hours with no fever (without taking a fever reducer) and with improving of symptoms.  If you have no symptoms but tested positive (or all symptoms resolve after 5 days and you have no fever) you can leave your house but continue to wear a mask around others for an additional 5 days. If you have a fever,continue to stay home until you have had 24 hours of no fever. Most cases improve 5-10 days from onset but we have seen a small number of patients who have gotten worse after the 10 days.  Please be sure to watch for worsening symptoms and remain taking the proper precautions.   Go to the nearest hospital ED for assessment if fever/cough/breathlessness are severe or illness seems like a threat to life.    The following symptoms may appear 2-14 days after exposure: . Fever . Cough . Shortness of breath or difficulty breathing . Chills . Repeated shaking with chills . Muscle pain . Headache . Sore throat . New loss of taste or smell . Fatigue . Congestion or runny nose . Nausea or vomiting . Diarrhea  You have been enrolled in [facility] for COVID-19. Daily you will receive a questionnaire within the [facility] website. Our COVID-19 response team will be monitoring your responses daily.  You can use medication such as prescription cough medication called Tessalon Perles 100 mg. You may take 1-2 capsules every 8 hours as needed for cough and  prescription inhaler called Albuterol MDI 90 mcg /actuation 2 puffs every 4 hours as needed for shortness of breath,  wheezing, cough  You may also take acetaminophen (Tylenol) as needed for fever.  HOME CARE: . Only take medications as instructed by your medical team. . Drink plenty of fluids and get plenty of rest. . A steam or ultrasonic humidifier can help if you have congestion.   GET HELP RIGHT AWAY IF YOU HAVE EMERGENCY WARNING SIGNS.  Call 911 or proceed to your closest emergency facility if: . You develop worsening high fever. . Trouble breathing . Bluish lips or face . Persistent pain or pressure in the chest . New confusion . Inability to wake or stay awake . You cough up blood. . Your symptoms become more severe . Inability to hold down food or fluids  This list is not all possible symptoms. Contact your medical provider for any symptoms that are severe or concerning to you.    Your e-visit answers were reviewed by a board certified advanced clinical practitioner to complete your personal care plan.  Depending on the condition, your plan could have included both over the counter or prescription medications.  If there is a problem please reply once you have received a response from your provider.  Your safety is important to [place].  If you have drug allergies check your prescription carefully.    You can use MyChart to ask questions about today's visit, request a non-urgent call back, or ask for a work or school excuse for 24 hours  related to this e-Visit. If it has been greater than 24 hours you will need to follow up with your provider, or enter a new e-Visit to address those concerns. You will get an e-mail in the next two days asking about your experience.  I hope that your e-visit has been valuable and will speed your recovery. Thank you for using e-visits.     Greater than 5 minutes, yet less than 10 minutes of time have been spent researching, coordinating and implementing care for this patient today.

## 2020-12-07 ENCOUNTER — Encounter: Payer: 59 | Admitting: Physical Therapy

## 2020-12-12 ENCOUNTER — Encounter: Payer: 59 | Admitting: Physical Therapy

## 2020-12-13 ENCOUNTER — Encounter: Payer: 59 | Admitting: Physical Therapy

## 2020-12-17 ENCOUNTER — Encounter: Payer: Self-pay | Admitting: Physical Therapy

## 2020-12-17 ENCOUNTER — Ambulatory Visit (INDEPENDENT_AMBULATORY_CARE_PROVIDER_SITE_OTHER): Payer: 59 | Admitting: Physical Therapy

## 2020-12-17 ENCOUNTER — Other Ambulatory Visit: Payer: Self-pay

## 2020-12-17 DIAGNOSIS — M25551 Pain in right hip: Secondary | ICD-10-CM | POA: Diagnosis not present

## 2020-12-17 DIAGNOSIS — M5441 Lumbago with sciatica, right side: Secondary | ICD-10-CM | POA: Diagnosis not present

## 2020-12-17 NOTE — Therapy (Signed)
[facility] [address] [place], [place], [address] Phone: [phone]   Fax:  [phone]  Physical Therapy Treatment  Patient Details  Name: Rachael [name] MRN: [id number] Date of Birth: 05-27-1986 Referring Provider (PT): Jacquiline Doe   Encounter Date: 12/17/2020   PT End of Session - 12/17/20 1641     Visit Number 4    Number of Visits 12    Date for PT Re-Evaluation 01/03/21    Authorization Type [facility]: UMR    PT Start Time 1605    PT Stop Time 1640    PT Time Calculation (min) 35 min    Activity Tolerance Patient tolerated treatment well;Patient limited by pain    Behavior During Therapy [facility] for tasks assessed/performed             Past Medical History:  Diagnosis Date   ADHD    GERD (gastroesophageal reflux disease)    Migraines     Past Surgical History:  Procedure Laterality Date   BREAST BIOPSY  2012   COLONOSCOPY  2011    There were no vitals filed for this visit.   Subjective Assessment - 12/17/20 1639     Subjective Pt has had a chance for some decrease in regular activity, after being off work for a week. She notes continued, significant pain in R anterior hip, and does have less pain in back.Thinks HEP is helping back, but hip still very sore with sitting and work activities.    Currently in Pain? Yes    Pain Score 5     Pain Location Hip    Pain Orientation Right    Pain Descriptors / Indicators Aching    Pain Type Acute pain    Pain Onset More than a month ago    Pain Frequency Intermittent                               [facility] Adult PT Treatment/Exercise - 12/17/20 0001       Lumbar Exercises: Stretches   Other Lumbar Stretch Exercise Hip flexor: kneeling 30 sec x 2      Lumbar Exercises: Standing   Functional Squats 10 reps    Other Standing Lumbar Exercises SL march x 10 bil; walk/march fwd/bwd 25 ft x 4;  Lateral band walk GTB 25 ft x 4;      Lumbar Exercises:  Supine   Clam 20 reps    Clam Limitations GTB    Bent Knee Raise --    Bent Knee Raise Limitations --    Bridge with clamshell 20 reps      Manual Therapy   Manual Therapy Passive ROM    Joint Mobilization HIp inf and lateral mobs wtih strap ;    Passive ROM R hip flexor stretch (thomas test position) therapist assisted with psoas release.    Manual Traction Long leg distraction on R, for hip pump                    PT Education - 12/17/20 1640     Education Details Reviewed HEP, discussed MD f/u    Person(s) Educated Patient    Methods Explanation;Tactile cues;Demonstration;Verbal cues    Comprehension Verbalized understanding;Returned demonstration;Verbal cues required;Tactile cues required;Need further instruction              PT Short Term Goals - 11/22/20 1514       PT  SHORT TERM GOAL #1   Title Pt to be independent with initial HEP    Time 2    Period Weeks    Status New    Target Date 12/06/20      PT SHORT TERM GOAL #2   Title Pt to report decreased pain to 2-3/10 in hip and back    Time 2    Period Weeks    Status New    Target Date 12/06/20               PT Long Term Goals - 11/22/20 1515       PT LONG TERM GOAL #1   Title Pt to be independent with final HEP    Time 6    Period Weeks    Status New    Target Date 01/03/21      PT LONG TERM GOAL #2   Title Pt to report decreased pain in hip and low back to 0-2/10 with sitting, bending, walking, to improve abilty for work duties    Time 6    Period Weeks    Status New    Target Date 01/03/21      PT LONG TERM GOAL #3   Title Pt to report ability for exercise, for at least 30 min, without pain greater than 2/10    Time 6    Period Weeks    Status New    Target Date 01/03/21      PT LONG TERM GOAL #4   Title Pt to demo improved ROM for hip and lumbar, to be WNL, and pain free, to improve ability for IADLS and functional activities.    Time 6    Period Weeks    Status New     Target Date 01/03/21                   Plan - 12/17/20 1642     Clinical Impression Statement Pt with more focused pain at anterior hip, with  less radiating pain and less pain in back. She is able to contract hip flexor without increased pain, and perform ther ex today without increased pain. She does however have much pain with increased hip flexion and IR ROM, as well as pain with sitting and with transitions after siting. Only mild improvements after hip mobilizations today. Discussed continuing pain free strengthening as she is able. Pt does have MD visit next week which i think will be helpful due to ongoing pain.    Personal Factors and Comorbidities Time since onset of injury/illness/exacerbation    Examination-Activity Limitations Bend;Sit;Sleep;Carry;Squat;Stairs;Stand;Lift;Locomotion Level    Examination-Participation Restrictions Meal Prep;Cleaning;Occupation;Community Activity;Yard Work;Laundry;Shop;Driving    Stability/Clinical Decision Making Evolving/Moderate complexity    Rehab Potential Good    PT Frequency 2x / week    PT Duration 6 weeks    PT Treatment/Interventions ADLs/Self Care Home Management;Cryotherapy;Electrical Stimulation;Gait training;DME Instruction;Ultrasound;Traction;Moist Heat;Iontophoresis 4mg /ml Dexamethasone;Stair training;Functional mobility training;Therapeutic activities;Therapeutic exercise;Balance training;Neuromuscular re-education;Patient/family education;Manual techniques;Taping;Dry needling;Passive range of motion;Vasopneumatic Device;Spinal Manipulations;Joint Manipulations    PT Home Exercise Plan [id number]    Consulted and Agree with Plan of Care Patient             Patient will benefit from skilled therapeutic intervention in order to improve the following deficits and impairments:  Decreased activity tolerance, Increased muscle spasms, Pain, Impaired flexibility, Decreased strength, Decreased mobility  Visit Diagnosis: Acute  right-sided low back pain with right-sided sciatica  Pain in right hip     Problem  List Patient Active Problem List   Diagnosis Date Noted   Alopecia 11/08/2020   GERD (gastroesophageal reflux disease) 05/16/2020   ADHD 10/12/2019   Migraines 10/12/2019   Myalgia 10/12/2019   Vitamin D deficiency 10/12/2019   Family history of colon cancer 10/12/2019    Sedalia Muta, PT, DPT 4:48 PM  12/17/20    [facility] [address] [place], [place], [address] Phone: [phone]   Fax:  [phone]  Name: Rachael [name] MRN: [id number] Date of Birth: 03-13-1986

## 2020-12-20 ENCOUNTER — Ambulatory Visit (INDEPENDENT_AMBULATORY_CARE_PROVIDER_SITE_OTHER): Payer: 59 | Admitting: Physical Therapy

## 2020-12-20 ENCOUNTER — Encounter: Payer: Self-pay | Admitting: Physical Therapy

## 2020-12-20 ENCOUNTER — Other Ambulatory Visit: Payer: Self-pay

## 2020-12-20 DIAGNOSIS — M5441 Lumbago with sciatica, right side: Secondary | ICD-10-CM

## 2020-12-20 DIAGNOSIS — M25551 Pain in right hip: Secondary | ICD-10-CM

## 2020-12-20 NOTE — Therapy (Addendum)
[facility] [address] [place], [place], [address] Phone: [phone]   Fax:  [phone]  Physical Therapy Treatment  Patient Details  Name: Rachael [name] MRN: [id number] Date of Birth: [birth year] Referring Provider (PT): Dimas Chyle   Encounter Date: 12/20/2020   PT End of Session - 12/20/20 0859     Visit Number 5    Number of Visits 12    Date for PT Re-Evaluation 01/03/21    Authorization Type [facility]: UMR    PT Start Time 0849    PT Stop Time 0928    PT Time Calculation (min) 39 min    Activity Tolerance Patient tolerated treatment well;Patient limited by pain    Behavior During Therapy [facility] for tasks assessed/performed             Past Medical History:  Diagnosis Date   ADHD    GERD (gastroesophageal reflux disease)    Migraines     Past Surgical History:  Procedure Laterality Date   BREAST BIOPSY  2012   COLONOSCOPY  2011    There were no vitals filed for this visit.   Subjective Assessment - 12/20/20 0855     Subjective Pt states mimial change in pain. Is eager to start exercising again.    Currently in Pain? Yes    Pain Score 2     Pain Location Hip    Pain Orientation Right    Pain Descriptors / Indicators Aching    Pain Type Acute pain    Pain Onset More than a month ago    Pain Frequency Intermittent    Aggravating Factors  sitting,driving, squatting,                               [facility] Adult PT Treatment/Exercise - 12/20/20 0001       Lumbar Exercises: Standing   Functional Squats --    Functional Squats Limitations GTB at thighs x10 no weight,  x15 with 25 lb ;    Other Standing Lumbar Exercises SL march with RTB x 10 bil; Lateral band walk GTB 25 ft x 4;    Other Standing Lumbar Exercises Step ups 6 in x 15 on R;      Lumbar Exercises: Supine   Clam 20 reps    Clam Limitations GTB alternating with TA    Bent Knee Raise 20 reps    Bent Knee Raise Limitations GTB  slow eccentrics    Bridge with clamshell 20 reps    Straight Leg Raise 20 reps    Straight Leg Raises Limitations R, reverse SLR      Manual Therapy   Manual Therapy Passive ROM    Joint Mobilization HIp inf and lateral mobs wtih strap ;    Passive ROM --    Manual Traction Long leg distraction on R, for lumbar pump                    PT Education - 12/20/20 0857     Person(s) Educated Patient    Methods Explanation;Demonstration;Tactile cues;Verbal cues    Comprehension Verbalized understanding;Returned demonstration;Verbal cues required;Tactile cues required              PT Short Term Goals - 11/22/20 1514       PT SHORT TERM GOAL #1   Title Pt to be independent with initial HEP    Time 2  Period Weeks    Status New    Target Date 12/06/20      PT SHORT TERM GOAL #2   Title Pt to report decreased pain to 2-3/10 in hip and back    Time 2    Period Weeks    Status New    Target Date 12/06/20               PT Long Term Goals - 11/22/20 1515       PT LONG TERM GOAL #1   Title Pt to be independent with final HEP    Time 6    Period Weeks    Status New    Target Date 01/03/21      PT LONG TERM GOAL #2   Title Pt to report decreased pain in hip and low back to 0-2/10 with sitting, bending, walking, to improve abilty for work duties    Time 6    Period Weeks    Status New    Target Date 01/03/21      PT LONG TERM GOAL #3   Title Pt to report ability for exercise, for at least 30 min, without pain greater than 2/10    Time 6    Period Weeks    Status New    Target Date 01/03/21      PT LONG TERM GOAL #4   Title Pt to demo improved ROM for hip and lumbar, to be WNL, and pain free, to improve ability for IADLS and functional activities.    Time 6    Period Weeks    Status New    Target Date 01/03/21                   Plan - 12/20/20 1007     Clinical Impression Statement Pt with continued pain deep in anterior hip,  increased pain with sitting at work and full flexion ROM. She has good ability to perform strengthening today, with hip in mid range hip flexion positions. Noted hip weakness with single leg stability with SLS and step ups today.Pt to see MD next week, discussed holding off on return to exercise class until after MD appt. Updated HEP.    Personal Factors and Comorbidities Time since onset of injury/illness/exacerbation    Examination-Activity Limitations Bend;Sit;Sleep;Carry;Squat;Stairs;Stand;Lift;Locomotion Level    Examination-Participation Restrictions Meal Prep;Cleaning;Occupation;Community Activity;Yard Work;Laundry;Shop;Driving    Stability/Clinical Decision Making Evolving/Moderate complexity    Rehab Potential Good    PT Frequency 2x / week    PT Duration 6 weeks    PT Treatment/Interventions ADLs/Self Care Home Management;Cryotherapy;Electrical Stimulation;Gait training;DME Instruction;Ultrasound;Traction;Moist Heat;Iontophoresis 71m/ml Dexamethasone;Stair training;Functional mobility training;Therapeutic activities;Therapeutic exercise;Balance training;Neuromuscular re-education;Patient/family education;Manual techniques;Taping;Dry needling;Passive range of motion;Vasopneumatic Device;Spinal Manipulations;Joint Manipulations    PT Home Exercise Plan [id number]    Consulted and Agree with Plan of Care Patient             Patient will benefit from skilled therapeutic intervention in order to improve the following deficits and impairments:  Decreased activity tolerance, Increased muscle spasms, Pain, Impaired flexibility, Decreased strength, Decreased mobility  Visit Diagnosis: Acute right-sided low back pain with right-sided sciatica  Pain in right hip     Problem List Patient Active Problem List   Diagnosis Date Noted   Alopecia 11/08/2020   GERD (gastroesophageal reflux disease) 05/16/2020   ADHD 10/12/2019   Migraines 10/12/2019   Myalgia 10/12/2019   Vitamin D  deficiency 10/12/2019   Family history of colon cancer 10/12/2019  Lyndee Hensen, PT, DPT 10:18 AM  12/20/20    [facility] [place], [place], [address] Phone: [phone]   Fax:  [phone]  Name: Rachael [name] MRN: [id number] Date of Birth: [birth year]  PHYSICAL THERAPY DISCHARGE SUMMARY  Visits from Start of Care: 5 Plan: Patient agrees to discharge.  Patient goals were partially  met. Patient is being discharged due to - referred back to MD due to ongoing pain.       Lyndee Hensen, PT, DPT 2:45 PM  07/04/21

## 2020-12-26 ENCOUNTER — Ambulatory Visit (INDEPENDENT_AMBULATORY_CARE_PROVIDER_SITE_OTHER): Payer: 59 | Admitting: Family Medicine

## 2020-12-26 ENCOUNTER — Ambulatory Visit: Payer: Self-pay

## 2020-12-26 ENCOUNTER — Other Ambulatory Visit (HOSPITAL_COMMUNITY): Payer: Self-pay

## 2020-12-26 ENCOUNTER — Other Ambulatory Visit: Payer: Self-pay

## 2020-12-26 ENCOUNTER — Ambulatory Visit (INDEPENDENT_AMBULATORY_CARE_PROVIDER_SITE_OTHER): Payer: 59

## 2020-12-26 VITALS — BP 100/70 | HR 85 | Ht 62.0 in | Wt 142.0 lb

## 2020-12-26 DIAGNOSIS — G8929 Other chronic pain: Secondary | ICD-10-CM | POA: Diagnosis not present

## 2020-12-26 DIAGNOSIS — M25551 Pain in right hip: Secondary | ICD-10-CM | POA: Diagnosis not present

## 2020-12-26 MED ORDER — MELOXICAM 7.5 MG PO TABS
7.5000 mg | ORAL_TABLET | Freq: Every day | ORAL | 0 refills | Status: DC
  Filled 2020-12-26: qty 30, 30d supply, fill #0

## 2020-12-26 MED ORDER — VITAMIN D (ERGOCALCIFEROL) 1.25 MG (50000 UNIT) PO CAPS
50000.0000 [IU] | ORAL_CAPSULE | ORAL | 0 refills | Status: DC
  Filled 2020-12-26: qty 12, 84d supply, fill #0

## 2020-12-26 NOTE — Progress Notes (Signed)
[name] [address] [place] [address] Phone: [phone] Subjective:   Bruce Donath, am serving as a scribe for Dr. Antoine Primas.  This visit occurred during the SARS-CoV-2 public health emergency.  Safety protocols were in place, including screening questions prior to the visit, additional usage of staff PPE, and extensive cleaning of exam room while observing appropriate contact time as indicated for disinfecting solutions.    I'm seeing this patient by the request  of:  Ardith Dark, MD  CC: hip pain   VPX:[id number]  Rachael [name] is a 35 y.o. female coming in with complaint of low back pain with pain that wraps around to R groin pain for past 2 months. Lower back pain has improved but R groin pain persists. Has had pain for years and is doing PT. Pain with hip flexion and hugging knees to chest that will produce sharp pain. Using IBU prn. Did try flexeril for back. Negative lumbar xray 11/08/2020.     Past Medical History:  Diagnosis Date   ADHD    GERD (gastroesophageal reflux disease)    Migraines    Past Surgical History:  Procedure Laterality Date   BREAST BIOPSY  2012   COLONOSCOPY  2011   Social History   Socioeconomic History   Marital status: Married    Spouse name: Not on file   Number of children: Not on file   Years of education: Not on file   Highest education level: Not on file  Occupational History   Not on file  Tobacco Use   Smoking status: Never   Smokeless tobacco: Never  Vaping Use   Vaping Use: Never used  Substance and Sexual Activity   Alcohol use: Yes   Drug use: Never   Sexual activity: Not on file  Other Topics Concern   Not on file  Social History Narrative   Not on file   Social Determinants of Health   Financial Resource Strain: Not on file  Food Insecurity: Not on file  Transportation Needs: Not on file  Physical Activity: Not on file  Stress: Not on file  Social  Connections: Not on file   No Known Allergies Family History  Problem Relation Age of Onset   Prostate cancer Maternal Grandfather        dx in 52s   Cancer Maternal Grandfather        colangiocarcinoma   Breast cancer Paternal Aunt        dx in 65s   Ovarian cancer Paternal Aunt        dx in 4s   Uterine cancer Paternal Aunt        dx in 21s   Colon cancer Paternal Aunt        dx in 10s   Prostate cancer Maternal Uncle        dx in 70s-50s   Colon cancer Father     Current Outpatient Medications (Endocrine & Metabolic):    etonogestrel-ethinyl estradiol (NUVARING) 0.12-0.015 MG/24HR vaginal ring, Place 1 each vaginally every 28 (twenty-eight) days. Insert vaginally and leave in place for 3 consecutive weeks, then remove for 1 week.   etonogestrel-ethinyl estradiol (NUVARING) 0.12-0.015 MG/24HR vaginal ring, INSERT 1 VAGINAL RING EVERY MONTH AS DIRECTED FOR 21 DAYS (Patient taking differently: INSERT 1 VAGINAL RING EVERY MONTH AS DIRECTED FOR 21 DAYS)   etonogestrel-ethinyl estradiol (NUVARING) 0.12-0.015 MG/24HR vaginal ring, INSERT 1 VAGINAL RING EVERY MONTH AS DIRECTED FOR 21  DAYS (Patient taking differently: INSERT 1 VAGINAL RING EVERY MONTH AS DIRECTED FOR 21 DAYS)   Current Outpatient Medications (Respiratory):    albuterol (VENTOLIN HFA) 108 (90 Base) MCG/ACT inhaler, Inhale 2 puffs into the lungs every 6 (six) hours as needed for wheezing or shortness of breath.   benzonatate (TESSALON) 100 MG capsule, Take 1 capsule (100 mg total) by mouth 3 (three) times daily as needed for cough.  Current Outpatient Medications (Analgesics):    diclofenac (VOLTAREN) 75 MG EC tablet, Take 1 tablet (75 mg total) by mouth 2 (two) times daily.   ibuprofen (ADVIL) 200 MG tablet, Take 200 mg by mouth every 6 (six) hours as needed.   naproxen (NAPROSYN) 250 MG tablet, Take by mouth as needed.   Current Outpatient Medications (Other):    buPROPion (WELLBUTRIN XL) 150 MG 24 hr tablet, Take 2  tablets (300 mg total) by mouth daily at bedtime.   cyclobenzaprine (FLEXERIL) 10 MG tablet, Take 1 tablet (10 mg total) by mouth 3 (three) times daily as needed for muscle spasms.   omeprazole (PRILOSEC) 40 MG capsule, TAKE 1 CAPSULE SHORTLY BEFORE BREAKFAST EACH DAY.   Reviewed prior external information including notes and imaging from  primary care provider As well as notes that were available from care everywhere and other healthcare systems.  Past medical history, social, surgical and family history all reviewed in electronic medical record.  No pertanent information unless stated regarding to the chief complaint.   Review of Systems:  No headache, visual changes, nausea, vomiting, diarrhea, constipation, dizziness, abdominal pain, skin rash, fevers, chills, night sweats, weight loss, swollen lymph nodes, body aches, joint swelling, chest pain, shortness of breath, mood changes. POSITIVE muscle aches  Objective  There were no vitals taken for this visit.   General: No apparent distress alert and oriented x3 mood and affect normal, dressed appropriately.  HEENT: Pupils equal, extraocular movements intact  Respiratory: Patient's speak in full sentences and does not appear short of breath  Cardiovascular: No lower extremity edema, non tender, no erythema  Gait normal with good balance and coordination.  MSK: Right hip exam shows the patient does have some tenderness with the hip flexor.  Patient does have pain normal in her with hip abduction seems to be more in the groin area.  Negative fulcrum test.  Patient has some discomfort with grind test but more pain with external rotation.  Neurovascularly intact distally  Limited musculoskeletal ultrasound was performed and interpreted by [name]  Limited ultrasound shows the patient hip joint may have some mild narrowing of the lateral aspect but no significant effusion noted.  No significant findings suggestive of the anterior labrum  either.  Patient also has a irregularity noted at the origin of the adductor with some cortical irregularity that could be secondary to chronic changes. Impression: Questionable findings consistent with hip impingement as well as possible chronic tendinopathy of the adductor    Impression and Recommendations:     The above documentation has been reviewed and is accurate and complete [name], DO

## 2020-12-26 NOTE — Patient Instructions (Signed)
Thigh compression sleeve with work Management consultant after activity Ok to work with Leotis Shames but more groin and focus on eccentrics Once weekly Vit D K2 daily for 30 days Meloxicam 7.5 daily for 10 days  See me in 5-6 weeks

## 2020-12-27 ENCOUNTER — Encounter: Payer: Self-pay | Admitting: Family Medicine

## 2020-12-27 DIAGNOSIS — G8929 Other chronic pain: Secondary | ICD-10-CM | POA: Insufficient documentation

## 2020-12-27 DIAGNOSIS — M25551 Pain in right hip: Secondary | ICD-10-CM | POA: Insufficient documentation

## 2020-12-27 NOTE — Assessment & Plan Note (Signed)
Patient's right hip pain does have a quite broad differential.  It appears that patient does have some mild impingement as well as injury to the adductor that is likely contributing.  Differential does include though intra-articular pathology including labral as well as chronic hip flexor tendinitis and even irritation to the obturator nerve.  At this point we will start with conservative therapy.  Patient does have muscle relaxers.  Hip x-ray pending.  Discussed diet compression sleeve and home exercises.  Patient is able to continue with physical therapy change patient's oral anti-inflammatory from diclofenac to meloxicam.  Follow-up with me in 4 to 6 weeks but if continuing pain and pain with imaging may be warranted

## 2020-12-31 ENCOUNTER — Encounter: Payer: 59 | Admitting: Physical Therapy

## 2021-01-18 ENCOUNTER — Other Ambulatory Visit (HOSPITAL_COMMUNITY): Payer: Self-pay

## 2021-01-18 MED FILL — Etonogestrel-Ethinyl Estradiol VA Ring 0.12-0.015 MG/24HR: VAGINAL | 84 days supply | Qty: 3 | Fill #1 | Status: AC

## 2021-01-21 ENCOUNTER — Other Ambulatory Visit (HOSPITAL_COMMUNITY): Payer: Self-pay

## 2021-01-25 ENCOUNTER — Telehealth: Payer: Self-pay

## 2021-01-25 NOTE — Telephone Encounter (Signed)
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    I have placed forms in Dr. Lavone Neri box.  Please advise if patient needs appt.    Is requesting call once ready for pick up.

## 2021-01-28 NOTE — Telephone Encounter (Signed)
Form received

## 2021-01-30 NOTE — Telephone Encounter (Signed)
Please f/u with pt on form status.

## 2021-01-30 NOTE — Telephone Encounter (Signed)
Patient's husband is calling in stating the forms are due tomorrow, did advise that typically forms are completed within 3-5 business days.

## 2021-01-31 NOTE — Telephone Encounter (Signed)
Rachael [name] has completed forms and made a copy for scan.  I have spoken with patient in regard to pick up.  Forms are in file folder up front.

## 2021-02-06 ENCOUNTER — Ambulatory Visit: Payer: 59 | Admitting: Family Medicine

## 2021-02-06 NOTE — Progress Notes (Deleted)
[name] [address] [place] [address] Phone: [phone] Subjective:    I'm seeing this patient by the request  of:  Ardith Dark, MD  CC: Hip pain follow-up  ALP:[id number]  Rachael [name] is a 35 y.o. female coming in with complaint of ***  Onset-  Location Duration-  Character- Aggravating factors- Reliving factors-  Therapies tried-  Severity-  Patient's previous images from the ultrasound were independently visualized by me.  Patient had findings that were consistent with questionable hip impingement as well as chronic tendinopathy of the adductor  X-rays of the hip were also independently visualized by me showing no significant bony abnormality.  Past Medical History:  Diagnosis Date   ADHD    GERD (gastroesophageal reflux disease)    Migraines    Past Surgical History:  Procedure Laterality Date   BREAST BIOPSY  2012   COLONOSCOPY  2011   Social History   Socioeconomic History   Marital status: Married    Spouse name: Not on file   Number of children: Not on file   Years of education: Not on file   Highest education level: Not on file  Occupational History   Not on file  Tobacco Use   Smoking status: Never   Smokeless tobacco: Never  Vaping Use   Vaping Use: Never used  Substance and Sexual Activity   Alcohol use: Yes   Drug use: Never   Sexual activity: Not on file  Other Topics Concern   Not on file  Social History Narrative   Not on file   Social Determinants of Health   Financial Resource Strain: Not on file  Food Insecurity: Not on file  Transportation Needs: Not on file  Physical Activity: Not on file  Stress: Not on file  Social Connections: Not on file   No Known Allergies Family History  Problem Relation Age of Onset   Prostate cancer Maternal Grandfather        dx in 47s   Cancer Maternal Grandfather        colangiocarcinoma   Breast cancer Paternal Aunt        dx in 30s    Ovarian cancer Paternal Aunt        dx in 36s   Uterine cancer Paternal Aunt        dx in 57s   Colon cancer Paternal Aunt        dx in 34s   Prostate cancer Maternal Uncle        dx in 49s-50s   Colon cancer Father     Current Outpatient Medications (Endocrine & Metabolic):    etonogestrel-ethinyl estradiol (NUVARING) 0.12-0.015 MG/24HR vaginal ring, Place 1 each vaginally every 28 (twenty-eight) days. Insert vaginally and leave in place for 3 consecutive weeks, then remove for 1 week.   etonogestrel-ethinyl estradiol (NUVARING) 0.12-0.015 MG/24HR vaginal ring, INSERT 1 VAGINAL RING EVERY MONTH AS DIRECTED FOR 21 DAYS (Patient taking differently: INSERT 1 VAGINAL RING EVERY MONTH AS DIRECTED FOR 21 DAYS)   etonogestrel-ethinyl estradiol (NUVARING) 0.12-0.015 MG/24HR vaginal ring, INSERT 1 VAGINAL RING EVERY MONTH AS DIRECTED FOR 21 DAYS (Patient taking differently: INSERT 1 VAGINAL RING EVERY MONTH AS DIRECTED FOR 21 DAYS)   Current Outpatient Medications (Respiratory):    albuterol (VENTOLIN HFA) 108 (90 Base) MCG/ACT inhaler, Inhale 2 puffs into the lungs every 6 (six) hours as needed for wheezing or shortness of breath.   benzonatate (TESSALON) 100 MG capsule, Take  1 capsule (100 mg total) by mouth 3 (three) times daily as needed for cough.  Current Outpatient Medications (Analgesics):    ibuprofen (ADVIL) 200 MG tablet, Take 200 mg by mouth every 6 (six) hours as needed.   meloxicam (MOBIC) 7.5 MG tablet, Take 1 tablet (7.5 mg total) by mouth daily.   Current Outpatient Medications (Other):    buPROPion (WELLBUTRIN XL) 150 MG 24 hr tablet, Take 2 tablets (300 mg total) by mouth daily at bedtime.   cyclobenzaprine (FLEXERIL) 10 MG tablet, Take 1 tablet (10 mg total) by mouth 3 (three) times daily as needed for muscle spasms.   omeprazole (PRILOSEC) 40 MG capsule, TAKE 1 CAPSULE SHORTLY BEFORE BREAKFAST EACH DAY.   Vitamin D, Ergocalciferol, (DRISDOL) 1.25 MG (50000 UNIT) CAPS  capsule, Take 1 capsule (50,000 Units total) by mouth every 7 (seven) days.   Reviewed prior external information including notes and imaging from  primary care provider As well as notes that were available from care everywhere and other healthcare systems.  Past medical history, social, surgical and family history all reviewed in electronic medical record.  No pertanent information unless stated regarding to the chief complaint.   Review of Systems:  No headache, visual changes, nausea, vomiting, diarrhea, constipation, dizziness, abdominal pain, skin rash, fevers, chills, night sweats, weight loss, swollen lymph nodes, body aches, joint swelling, chest pain, shortness of breath, mood changes. POSITIVE muscle aches  Objective  There were no vitals taken for this visit.   General: No apparent distress alert and oriented x3 mood and affect normal, dressed appropriately.  HEENT: Pupils equal, extraocular movements intact  Respiratory: Patient's speak in full sentences and does not appear short of breath  Cardiovascular: No lower extremity edema, non tender, no erythema  Gait normal with good balance and coordination.  MSK: Hip exam shows    Impression and Recommendations:     The above documentation has been reviewed and is accurate and complete [name], DO

## 2021-02-11 ENCOUNTER — Ambulatory Visit (INDEPENDENT_AMBULATORY_CARE_PROVIDER_SITE_OTHER): Payer: 59 | Admitting: Neurology

## 2021-02-11 ENCOUNTER — Encounter: Payer: Self-pay | Admitting: Neurology

## 2021-02-11 ENCOUNTER — Other Ambulatory Visit (HOSPITAL_COMMUNITY): Payer: Self-pay

## 2021-02-11 ENCOUNTER — Other Ambulatory Visit: Payer: Self-pay

## 2021-02-11 VITALS — BP 98/61 | HR 76 | Ht 62.0 in | Wt 143.0 lb

## 2021-02-11 DIAGNOSIS — G44209 Tension-type headache, unspecified, not intractable: Secondary | ICD-10-CM | POA: Diagnosis not present

## 2021-02-11 DIAGNOSIS — G43009 Migraine without aura, not intractable, without status migrainosus: Secondary | ICD-10-CM | POA: Diagnosis not present

## 2021-02-11 DIAGNOSIS — G4486 Cervicogenic headache: Secondary | ICD-10-CM

## 2021-02-11 MED ORDER — NURTEC 75 MG PO TBDP
75.0000 mg | ORAL_TABLET | Freq: Once | ORAL | 3 refills | Status: DC | PRN
  Filled 2021-02-11: qty 8, 30d supply, fill #0
  Filled 2021-02-18: qty 10, 30d supply, fill #0

## 2021-02-11 MED ORDER — AMITRIPTYLINE HCL 25 MG PO TABS
25.0000 mg | ORAL_TABLET | Freq: Every day | ORAL | 3 refills | Status: DC
  Filled 2021-02-11: qty 60, 60d supply, fill #0

## 2021-02-11 NOTE — Progress Notes (Signed)
Subjective:    Patient ID: Rachael [name] is a 35 y.o. female.    Huston Foley, MD, PhD [facility] [address] [address] [place], [place] [address]  Dear Dr. [name],  I saw your patient, Rachael [name], upon your kind request in my neurologic clinic today for initial consultation of her recurrent headaches, concern for migraines.  The patient is unaccompanied today.  As you know, Rachael [name] is a 35 year old right-handed woman with an underlying medical history of ADHD, reflux disease, recurrent headaches, vitamin D deficiency, alopecia, and low back pain, who reports a several year history of migraine headaches.  She reports that she has had a migraine about once a month.  It is associated with nausea, typically no vomiting, throbbing headache, light sensitivity, it helps to lay down in a dark room.  She has not been on any prescription medication for migraines.  More frequently, about 3 times a week she will get a headache that is either from the neck forward and upwards or a pressure-like sensation around the temples and the forehead.  She reports that part of her headaches could be from jaw clenching.  It helps to use a bite guard.  She also reports barometric pressure sensitivity and headaches from pressure changes, other triggers include wearing something around the ears such as a hat or even sunglasses.  She denies any associated neurological accompaniments such as one-sided weakness or numbness or tingling or droopy face or slurring of speech.  It helps to get a massage.  She gets a massage on a regular basis.  For low back pain and groin pain as well as hip pain she has seen Dr. Antoine Primas in sports medicine.  She has been taking meloxicam as needed as well as vitamin D prescription.  She also reports neck muscle tightness at times.  Sometimes she has neck pain that radiates to the right arm.  She has not had any imaging to the neck or head.  An MRI was  ordered in the past but she did not have it done due to the pandemic.  Her previous primary care physician, Dr. Duanne Guess, had felt that her headaches could be from Adderall which she was on for her ADHD.  She was then tried on Strattera and still had headaches. I reviewed your office note from 11/08/2020.  For headache management she has tried only over-the-counter medication such as Tylenol and Advil.  She does have a prescription for Flexeril for low back pain. She works as an Scientist, forensic.  She lives with her husband and 2 cats.  She drinks caffeine in the form of coffee, 1-1/2 cups in the mornings, rare alcohol, maybe once a year.  She sleeps fairly well, denies any snoring, has had rare morning headaches.  She had an eye exam with Dr. Georges Mouse some 2 or 3 years ago.  She denies any visual symptoms such as blurry vision, double vision or loss of vision.  She does not typically have an aura with her migraine.   Her Past Medical History Is Significant For: Past Medical History:  Diagnosis Date   ADHD    GERD (gastroesophageal reflux disease)    Migraines     Her Past Surgical History Is Significant For: Past Surgical History:  Procedure Laterality Date   BREAST BIOPSY  2012   COLONOSCOPY  2011    Her Family History Is Significant For: Family History  Problem Relation Age of Onset  Migraines Mother    Colon cancer Father    Prostate cancer Maternal Grandfather        dx in 7940s   Cancer Maternal Grandfather        colangiocarcinoma   Prostate cancer Maternal Uncle        dx in 5840s-50s   Breast cancer Paternal Aunt        dx in 5760s   Ovarian cancer Paternal Aunt        dx in 10040s   Uterine cancer Paternal Aunt        dx in 2640s   Colon cancer Paternal Aunt        dx in 7760s    Her Social History Is Significant For: Social History   Socioeconomic History   Marital status: Married    Spouse name: Not on file   Number of children: Not on file   Years of education: Not on file    Highest education level: Not on file  Occupational History   Not on file  Tobacco Use   Smoking status: Never   Smokeless tobacco: Never  Vaping Use   Vaping Use: Never used  Substance and Sexual Activity   Alcohol use: Yes   Drug use: Never   Sexual activity: Not on file  Other Topics Concern   Not on file  Social History Narrative   Not on file   Social Determinants of Health   Financial Resource Strain: Not on file  Food Insecurity: Not on file  Transportation Needs: Not on file  Physical Activity: Not on file  Stress: Not on file  Social Connections: Not on file    Her Allergies Are:  No Known Allergies:   Her Current Medications Are:  Outpatient Encounter Medications as of 02/11/2021  Medication Sig   buPROPion (WELLBUTRIN XL) 150 MG 24 hr tablet Take 2 tablets (300 mg total) by mouth daily at bedtime.   cyclobenzaprine (FLEXERIL) 10 MG tablet Take 1 tablet (10 mg total) by mouth 3 (three) times daily as needed for muscle spasms.   etonogestrel-ethinyl estradiol (NUVARING) 0.12-0.015 MG/24HR vaginal ring INSERT 1 VAGINAL RING EVERY MONTH AS DIRECTED FOR 21 DAYS (Patient taking differently: INSERT 1 VAGINAL RING EVERY MONTH AS DIRECTED FOR 21 DAYS)   ibuprofen (ADVIL) 200 MG tablet Take 200 mg by mouth every 6 (six) hours as needed.   meloxicam (MOBIC) 7.5 MG tablet Take 1 tablet (7.5 mg total) by mouth daily.   Vitamin D, Ergocalciferol, (DRISDOL) 1.25 MG (50000 UNIT) CAPS capsule Take 1 capsule (50,000 Units total) by mouth every 7 (seven) days.   benzonatate (TESSALON) 100 MG capsule Take 1 capsule (100 mg total) by mouth 3 (three) times daily as needed for cough.   etonogestrel-ethinyl estradiol (NUVARING) 0.12-0.015 MG/24HR vaginal ring Place 1 each vaginally every 28 (twenty-eight) days. Insert vaginally and leave in place for 3 consecutive weeks, then remove for 1 week.   [DISCONTINUED] albuterol (VENTOLIN HFA) 108 (90 Base) MCG/ACT inhaler Inhale 2 puffs into the  lungs every 6 (six) hours as needed for wheezing or shortness of breath.   [DISCONTINUED] etonogestrel-ethinyl estradiol (NUVARING) 0.12-0.015 MG/24HR vaginal ring INSERT 1 VAGINAL RING EVERY MONTH AS DIRECTED FOR 21 DAYS (Patient taking differently: INSERT 1 VAGINAL RING EVERY MONTH AS DIRECTED FOR 21 DAYS)   [DISCONTINUED] omeprazole (PRILOSEC) 40 MG capsule TAKE 1 CAPSULE SHORTLY BEFORE BREAKFAST EACH DAY.   No facility-administered encounter medications on file as of 02/11/2021.  :   Review of  Systems:  Out of a complete 14 point review of systems, all are reviewed and negative with the exception of these symptoms as listed below:  Migraine     Review of Systems  Neurological:        Pt is here for migraines. Pt states gets migraines 4 to 5 times a week. Pt states she can still function day to day. Pt states she does have at least 1 out of the five she is down and out not able to do anything .     Objective:  Neurological Exam  Physical Exam Physical Examination:   Vitals:   02/11/21 0748  BP: 98/61  Pulse: 76    General Examination: The patient is a very pleasant 35 y.o. female in no acute distress. She appears well-developed and well-nourished and well groomed.   HEENT: Normocephalic, atraumatic, pupils are equal, round and reactive to light and accommodation. Funduscopic exam is normal with sharp disc margins noted. Extraocular tracking is good without limitation to gaze excursion or nystagmus noted. Normal smooth pursuit is noted. Hearing is grossly intact with tuning fork. Face is symmetric with normal facial animation and normal facial sensation. Speech is clear with no dysarthria noted. There is no hypophonia. There is no lip, neck/head, jaw or voice tremor. Neck is supple with full range of passive and active motion. There are no carotid bruits on auscultation.   Chest: Clear to auscultation without wheezing, rhonchi or crackles noted.  Heart: S1+S2+0, regular and  normal without murmurs, rubs or gallops noted.   Abdomen: Soft, non-tender and non-distended with normal bowel sounds appreciated on auscultation.  Extremities: There is no pitting edema in the distal lower extremities bilaterally. Pedal pulses are intact.  Skin: Warm and dry without trophic changes noted. There are no varicose veins.  Musculoskeletal: exam reveals no obvious joint deformities, tenderness or joint swelling or erythema.   Neurologically:  Mental status: The patient is awake, alert and oriented in all 4 spheres. Her immediate and remote memory, attention, language skills and fund of knowledge are appropriate. There is no evidence of aphasia, agnosia, apraxia or anomia. Speech is clear with normal prosody and enunciation. Thought process is linear. Mood is normal and affect is normal.  Cranial nerves II - XII are as described above under HEENT exam. In addition: shoulder shrug is normal with equal shoulder height noted. Motor exam: Normal bulk, strength and tone is noted. There is no drift, tremor or rebound. Romberg is negative. Reflexes are 1+ throughout. Babinski: Toes are flexor bilaterally. Fine motor skills and coordination: intact with normal finger taps, normal hand movements, normal rapid alternating patting, normal foot taps and normal foot agility.  Cerebellar testing: No dysmetria or intention tremor on finger to nose testing. Heel to shin is unremarkable bilaterally. There is no truncal or gait ataxia.  Sensory exam: intact to light touch, vibration, and temperature sense in the upper and lower extremities.  Gait, station and balance: She stands easily. No veering to one side is noted. No leaning to one side is noted. Posture is age-appropriate and stance is narrow based. Gait shows normal stride length and normal pace. No problems turning are noted. Tandem walk is unremarkable.   Assessment and plan:   In summary, Rachael [name] is a very pleasant 35 y.o.-year old  female with an underlying medical history of ADHD, reflux disease, recurrent headaches, vitamin D deficiency, alopecia, and low back pain, who presents for evaluation of her recurrent headaches.  She likely  has a combination of headaches including tension headaches which seem to be the most prominent, cervicogenic headaches, headaches from jaw clenching and also migraine headaches which appear to be about once a month at this time.  Triggers include barometric pressure changes, jaw clenching, neck pain, and wearing something around the head or ears.  She has a nonfocal neurological exam and is largely reassured in that regard.  For migraine and tension headache management I suggested prevention with low-dose amitriptyline, starting at 25 mg strength with half a pill titrating up to 2 pills in weekly increments.  We talked about expectations and possible common side effects.  In particular, she is advised that it can be sedating.  She reports that she occasionally has to be on-call for her work.  She is advised that she may not be safe to drive and may not be able to take it when she has called.  She is furthermore advised to seek a repeat formal eye exam as she has not seen her ophthalmologist in about 2 to 3 years.  She has seen sports medicine specialty for her low back pain and may be able to see them for the neck as well.  She is encouraged to talk to Dr. Katrinka Blazing about her neck pain.  She has never had a brain scan, I would like to make sure there is no structural cause of her recurrent headaches.  I would like to order a brain MRI with and without contrast.  In preparation for her brain MRI, I would like to have an updated chemistry panel on file.  She is agreeable to this.  For as needed use for her migraine attack, I suggested a trial of Nurtec 75 mg strength once as needed for migraine, may repeat once after 2 hours.  She was given detailed written instructions and I sent 2 prescriptions to her pharmacy,  namely for amitriptyline and Nurtec.  She is advised to follow-up routinely in this clinic in 3 months to see one of our nurse practitioners.  We will call her in the interim with updates on scheduling and results of her brain MRI.  I answered all her questions today and she was in agreement.   Thank you very much for allowing me to participate in the care of this nice patient. If I can be of any further assistance to you please do not hesitate to call me at [phone].  Sincerely,   Huston Foley, MD, PhD

## 2021-02-11 NOTE — Patient Instructions (Signed)
It was nice to meet you today.  I do believe you have several different types of headaches, your migraines are thankfully more infrequent, I think you have tension headaches as well as what we call cervicogenic headaches, meaning headaches that come from the neck.  It may be worthwhile consulting with a spine specialist, I would recommend that you also talk to your sports medicine specialist first.  I would like for you to have an updated eye examination, please schedule an appointment with Dr. Georges Mouse.   Your exam is normal thankfully.  We will do a brain scan, called MRI and call you with the test results. We will have to schedule you for this on a separate date. This test requires authorization from your insurance, and we will take care of the insurance process.  Please remember, common headache triggers are: sleep deprivation, dehydration, overheating, stress, hypoglycemia or skipping meals and blood sugar fluctuations, excessive pain medications or excessive alcohol use or caffeine withdrawal. Some people have food triggers such as aged cheese, orange juice or chocolate, especially dark chocolate, or MSG (monosodium glutamate). Try to avoid these headache triggers as much possible. It may be helpful to keep a headache diary to figure out what makes your headaches worse or brings them on and what alleviates them. Some people report headache onset after exercise but studies have shown that regular exercise may actually prevent headaches from coming. If you have exercise-induced headaches, please make sure that you drink plenty of fluid before and after exercising and that you do not over do it and do not overheat.  For migraine and tension headache prevention, I would like to suggest: Elavil (generic name: amitriptyline) 25 mg: Take half a pill daily at bedtime for one week, then one pill daily at bedtime for one week, then one and a half pills daily at bedtime for one week, then 2 pills daily  at bedtime thereafter. Common side effects reported are: mouth dryness, drowsiness, confusion, dizziness.   For as needed use for acute migraine: We will try you on Nurtec 75 mg strength: Take 1 pill at onset of migraine headache, may repeat in 2 hours, no more than 2 pills in 24 hours. May cause sedation and nausea.

## 2021-02-12 ENCOUNTER — Telehealth: Payer: Self-pay | Admitting: Neurology

## 2021-02-12 NOTE — Telephone Encounter (Signed)
LVM for pt to call back about scheduling mri  [facility] auth: [employer] spoke to Roger Shelter Ref # [id number]

## 2021-02-13 NOTE — Telephone Encounter (Signed)
X2 LVM  °

## 2021-02-14 NOTE — Telephone Encounter (Signed)
Patient returned my call she is scheduled at [facility] for 02/20/21.

## 2021-02-15 LAB — COMPREHENSIVE METABOLIC PANEL
ALT: 9 IU/L (ref 0–32)
AST: 11 IU/L (ref 0–40)
Albumin/Globulin Ratio: 1.9 (ref 1.2–2.2)
Albumin: 4.1 g/dL (ref 3.8–4.8)
Alkaline Phosphatase: 59 IU/L (ref 44–121)
BUN/Creatinine Ratio: 11 (ref 9–23)
BUN: 10 mg/dL (ref 6–20)
Bilirubin Total: 0.3 mg/dL (ref 0.0–1.2)
CO2: 19 mmol/L — ABNORMAL LOW (ref 20–29)
Calcium: 9.1 mg/dL (ref 8.7–10.2)
Chloride: 102 mmol/L (ref 96–106)
Creatinine, Ser: 0.87 mg/dL (ref 0.57–1.00)
Globulin, Total: 2.2 g/dL (ref 1.5–4.5)
Glucose: 85 mg/dL (ref 65–99)
Potassium: 4.5 mmol/L (ref 3.5–5.2)
Sodium: 139 mmol/L (ref 134–144)
Total Protein: 6.3 g/dL (ref 6.0–8.5)
eGFR: 88 mL/min/{1.73_m2} (ref >59)

## 2021-02-18 ENCOUNTER — Encounter: Payer: Self-pay | Admitting: Neurology

## 2021-02-18 ENCOUNTER — Other Ambulatory Visit (HOSPITAL_COMMUNITY): Payer: Self-pay

## 2021-02-18 ENCOUNTER — Telehealth: Payer: Self-pay | Admitting: *Deleted

## 2021-02-18 NOTE — Telephone Encounter (Signed)
-----   Message from Huston Foley, MD sent at 02/18/2021 12:49 PM EDT ----- Labs are benign.  Please update patient.

## 2021-02-19 ENCOUNTER — Other Ambulatory Visit (HOSPITAL_COMMUNITY): Payer: Self-pay

## 2021-02-19 ENCOUNTER — Telehealth: Payer: Self-pay | Admitting: Neurology

## 2021-02-19 MED ORDER — RIZATRIPTAN BENZOATE 5 MG PO TBDP
5.0000 mg | ORAL_TABLET | ORAL | 0 refills | Status: DC | PRN
  Filled 2021-02-19: qty 10, 22d supply, fill #0

## 2021-02-19 NOTE — Telephone Encounter (Addendum)
I submitted PA request for nurtec on CMM, Key: [id number].   Patient has not tried any Triptans in the past and this may cause Nurtec to be denied. Awaiting determination from [employer].

## 2021-02-19 NOTE — Telephone Encounter (Signed)
Nurtec denied. Patient must try/fail at least one triptan. Please advise.   This request has not been approved. Based on the information submitted for review, you did not meet our guideline rules for the requested drug. In order for your request to be approved, your provider would need to show that you have met the guideline rules below. The details below are written in medical language. If you have questions, please contact your provider. In some cases, the requested medication or alternatives offered may have additional approval requirements. Your provider requested Nurtec ODT 62m tablets for the acute (quick onset) treatment of migraine. When used for the acute treatment of migraine, our guideline named RIMEGEPANT (Nurtec ODT) requires that you have had a previous trial of at least one triptan (such as sumatriptan or rizatriptan), unless there is a medical reason why you cannot (contraindication to).

## 2021-02-19 NOTE — Telephone Encounter (Signed)
Please call patient and advise her that her Nurtec was denied because she has not tried a so-called triptan.  If she is agreeable, I would like to send a prescription for Maxalt to her pharmacy which is one of the more traditional medications for acute migraines.  Please advise patient to start Maxalt orally disintegrating tab, 5 mg: take 1 pill early on when you suspect a migraine attack come on. You may take another pill within 2 hours, no more than 2 pills in 24 hours. Most people who take triptans do not have any serious side-effects. However, they can cause drowsiness (remember to not drive or use heavy machinery when drowsy), nausea, dizziness, dry mouth. Less common side effects include strange sensations, such as tightness in your chest or throat, tingling, flushing, and feelings of heaviness or pressure in areas such as the face, limbs, and chest. These in the chest can mimic heart related pain (angina) and may cause alarm, but usually these sensations are not harmful or a sign of a heart attack. However, if you develop intense chest pain or sensations of discomfort, you should stop taking your medication and consult with me or your PCP or go to the nearest urgent care facility or ER or call 911.

## 2021-02-20 ENCOUNTER — Other Ambulatory Visit: Payer: Self-pay

## 2021-02-20 ENCOUNTER — Ambulatory Visit: Payer: 59

## 2021-02-20 DIAGNOSIS — G44209 Tension-type headache, unspecified, not intractable: Secondary | ICD-10-CM | POA: Diagnosis not present

## 2021-02-20 DIAGNOSIS — G4486 Cervicogenic headache: Secondary | ICD-10-CM

## 2021-02-20 DIAGNOSIS — G43009 Migraine without aura, not intractable, without status migrainosus: Secondary | ICD-10-CM

## 2021-02-20 MED ORDER — GADOBENATE DIMEGLUMINE 529 MG/ML IV SOLN
15.0000 mL | Freq: Once | INTRAVENOUS | Status: AC | PRN
  Administered 2021-02-20: 15 mL via INTRAVENOUS

## 2021-02-21 ENCOUNTER — Other Ambulatory Visit (HOSPITAL_COMMUNITY): Payer: Self-pay

## 2021-02-26 ENCOUNTER — Telehealth: Payer: Self-pay

## 2021-02-26 NOTE — Telephone Encounter (Signed)
Will send pt a mychart message regarding normal MRI.

## 2021-02-26 NOTE — Telephone Encounter (Signed)
-----   Message from Huston Foley, MD sent at 02/26/2021 10:34 AM EDT ----- Please advise patient that her brain MRI was reported as normal.

## 2021-03-06 NOTE — Progress Notes (Signed)
[name] [address] [place] [address] Phone: [phone] Subjective:   Rachael [name], am serving as a scribe for Dr. Antoine Primas. This visit occurred during the SARS-CoV-2 public health emergency.  Safety protocols were in place, including screening questions prior to the visit, additional usage of staff PPE, and extensive cleaning of exam room while observing appropriate contact time as indicated for disinfecting solutions.   I'm seeing this patient by the request  of:  Ardith Dark, MD  CC: Right hip pain follow-up, I neck pain and headache.  [id number]  12/26/2020 Patient's right hip pain does have a quite broad differential.  It appears that patient does have some mild impingement as well as injury to the adductor that is likely contributing.  Differential does include though intra-articular pathology including labral as well as chronic hip flexor tendinitis and even irritation to the obturator nerve.  At this point we will start with conservative therapy.  Patient does have muscle relaxers.  Hip x-ray pending.  Discussed diet compression sleeve and home exercises.  Patient is able to continue with physical therapy change patient's oral anti-inflammatory from diclofenac to meloxicam.  Follow-up with me in 4 to 6 weeks but if continuing pain and pain with imaging may be warranted.  Update 03/07/2021 Rachael [name] is a 35 y.o. female coming in with complaint of right hip and wrist pain. Patient states her hip and wrist are doing well. Having some pain here and there. She wanted to speak about her spine. Went to see a neurologist about her headaches and was recommended to check her spine. Neurologist thinks they are cervicogenic headaches.  Reviewed patient's MRI of the brain.  This was completely unremarkable.   Right hip x-rays taken on June 23 were independently visualized by me showing no significant bony abnormality.    Past Medical  History:  Diagnosis Date   ADHD    GERD (gastroesophageal reflux disease)    Migraines    Past Surgical History:  Procedure Laterality Date   BREAST BIOPSY  2012   COLONOSCOPY  2011   Social History   Socioeconomic History   Marital status: Married    Spouse name: Not on file   Number of children: Not on file   Years of education: Not on file   Highest education level: Not on file  Occupational History   Not on file  Tobacco Use   Smoking status: Never   Smokeless tobacco: Never  Vaping Use   Vaping Use: Never used  Substance and Sexual Activity   Alcohol use: Yes   Drug use: Never   Sexual activity: Not on file  Other Topics Concern   Not on file  Social History Narrative   Not on file   Social Determinants of Health   Financial Resource Strain: Not on file  Food Insecurity: Not on file  Transportation Needs: Not on file  Physical Activity: Not on file  Stress: Not on file  Social Connections: Not on file   No Known Allergies Family History  Problem Relation Age of Onset   Migraines Mother    Colon cancer Father    Prostate cancer Maternal Grandfather        dx in 77s   Cancer Maternal Grandfather        colangiocarcinoma   Prostate cancer Maternal Uncle        dx in 66s-50s   Breast cancer Paternal Aunt  dx in 91s   Ovarian cancer Paternal Aunt        dx in 17s   Uterine cancer Paternal Aunt        dx in 59s   Colon cancer Paternal Aunt        dx in 29s    Current Outpatient Medications (Endocrine & Metabolic):    etonogestrel-ethinyl estradiol (NUVARING) 0.12-0.015 MG/24HR vaginal ring, Place 1 each vaginally every 28 (twenty-eight) days. Insert vaginally and leave in place for 3 consecutive weeks, then remove for 1 week.   etonogestrel-ethinyl estradiol (NUVARING) 0.12-0.015 MG/24HR vaginal ring, INSERT 1 VAGINAL RING EVERY MONTH AS DIRECTED FOR 21 DAYS (Patient taking differently: INSERT 1 VAGINAL RING EVERY MONTH AS DIRECTED FOR 21  DAYS)   Current Outpatient Medications (Respiratory):    benzonatate (TESSALON) 100 MG capsule, Take 1 capsule (100 mg total) by mouth 3 (three) times daily as needed for cough.  Current Outpatient Medications (Analgesics):    ibuprofen (ADVIL) 200 MG tablet, Take 200 mg by mouth every 6 (six) hours as needed.   meloxicam (MOBIC) 7.5 MG tablet, Take 1 tablet (7.5 mg total) by mouth daily.   Rimegepant Sulfate (NURTEC) 75 MG TBDP, Take 75 mg by mouth once as needed for up to 1 dose.   rizatriptan (MAXALT-MLT) 5 MG disintegrating tablet, Take 1 tablet (5 mg total) by mouth as needed for migraine. May repeat in 2 hrs if needed. Max of  2 tabs in 24 hrs and no more than 3 tabs in 1 week.   Current Outpatient Medications (Other):    amitriptyline (ELAVIL) 25 MG tablet, Take 1 tablet (25 mg total) by mouth at bedtime. Follow titration instructions provided separately in writing.   buPROPion (WELLBUTRIN XL) 150 MG 24 hr tablet, Take 2 tablets (300 mg total) by mouth daily at bedtime.   cyclobenzaprine (FLEXERIL) 10 MG tablet, Take 1 tablet (10 mg total) by mouth 3 (three) times daily as needed for muscle spasms.   Vitamin D, Ergocalciferol, (DRISDOL) 1.25 MG (50000 UNIT) CAPS capsule, Take 1 capsule (50,000 Units total) by mouth every 7 (seven) days.   Reviewed prior external information including notes and imaging from  primary care provider also reviewed patient brain MRI which was normal  As well as notes that were available from care everywhere and other healthcare systems.  Past medical history, social, surgical and family history all reviewed in electronic medical record.  No pertanent information unless stated regarding to the chief complaint.   Review of Systems:  No headache, visual changes, nausea, vomiting, diarrhea, constipation, dizziness, abdominal pain, skin rash, fevers, chills, night sweats, weight loss, swollen lymph nodes, body aches, joint swelling, chest pain, shortness of  breath, mood changes. POSITIVE muscle aches  Objective  Blood pressure 112/72, pulse 89, height 5\' 2"  (1.575 m), weight 139 lb (63 kg), SpO2 98 %.   General: No apparent distress alert and oriented x3 mood and affect normal, dressed appropriately.  HEENT: Pupils equal, extraocular movements intact  Respiratory: Patient's speak in full sentences and does not appear short of breath  Cardiovascular: No lower extremity edema, non tender, no erythema  Gait normal with good balance and coordination.  MSK: Still mild impingement noted.  Right hip. Patient's neck does have limited range of motion lacking the last 10 degrees increase of flexion. Negative Spurling's.  Osteopathic findings C2 flexed rotated and side bent right C7 flexed rotated left side bent left Extended rotated and side bent right with   Impression  and Recommendations:     The above documentation has been reviewed and is accurate and complete Lyndal Pulley, DO

## 2021-03-07 ENCOUNTER — Other Ambulatory Visit: Payer: Self-pay

## 2021-03-07 ENCOUNTER — Ambulatory Visit: Payer: Self-pay

## 2021-03-07 ENCOUNTER — Ambulatory Visit (INDEPENDENT_AMBULATORY_CARE_PROVIDER_SITE_OTHER): Payer: 59 | Admitting: Family Medicine

## 2021-03-07 VITALS — BP 112/72 | HR 89 | Ht 62.0 in | Wt 139.0 lb

## 2021-03-07 DIAGNOSIS — M25531 Pain in right wrist: Secondary | ICD-10-CM | POA: Diagnosis not present

## 2021-03-07 DIAGNOSIS — M9902 Segmental and somatic dysfunction of thoracic region: Secondary | ICD-10-CM | POA: Diagnosis not present

## 2021-03-07 DIAGNOSIS — G8929 Other chronic pain: Secondary | ICD-10-CM | POA: Diagnosis not present

## 2021-03-07 DIAGNOSIS — M25551 Pain in right hip: Secondary | ICD-10-CM | POA: Diagnosis not present

## 2021-03-07 DIAGNOSIS — M9901 Segmental and somatic dysfunction of cervical region: Secondary | ICD-10-CM | POA: Diagnosis not present

## 2021-03-07 DIAGNOSIS — M9908 Segmental and somatic dysfunction of rib cage: Secondary | ICD-10-CM

## 2021-03-07 DIAGNOSIS — G4486 Cervicogenic headache: Secondary | ICD-10-CM | POA: Insufficient documentation

## 2021-03-07 NOTE — Assessment & Plan Note (Signed)
Patient states that she has been able to run which is fantastic.  Still having difficulty with rolling.  Patient does okay with the progress she is making and would like to continue with the same regimen.

## 2021-03-07 NOTE — Assessment & Plan Note (Signed)
   Decision today to treat with OMT was based on Physical Exam  After verbal consent patient was treated with HVLA, ME, FPR techniques in cervical, thoracic, rib,areas, all areas are chronic   Patient tolerated the procedure well with improvement in symptoms  Patient given exercises, stretches and lifestyle modifications  See medications in patient instructions if given  Patient will follow up in 4-8 weeks 

## 2021-03-07 NOTE — Assessment & Plan Note (Signed)
Cervicogenic headache.  Discussed with patient at great length.  Discussed icing regimen and home exercises.  We discussed different medications that could potentially help with some of her oral difficulties.  We discussed potential change to Effexor.  Patient will hopefully will do relatively well with the home exercises and work with Event organiser.  Responded well to manipulation.  Follow-up with me again in 4 to 8 weeks

## 2021-03-07 NOTE — Patient Instructions (Signed)
Exercises at least 3x a week CoQ10 200 mg daily Read about Effexor See you again in 4-6 weeks   Venlafaxine Tablets What is this medication? VENLAFAXINE (VEN la fax een) treats depression and anxiety. It increases the amount of serotonin and norepinephrine in the brain, hormones that help regulate mood. It belongs to a group of medications called SNRIs. This medicine may be used for other purposes; ask your health care provider or pharmacist if you have questions. COMMON BRAND NAME(S): Effexor What should I tell my care team before I take this medication? They need to know if you have any of these conditions: Bleeding disorders Glaucoma Heart disease High blood pressure High cholesterol Kidney disease Liver disease Low levels of sodium in the blood Mania or bipolar disorder Seizures Suicidal thoughts, plans, or attempt; a previous suicide attempt by you or a family member Take medications that treat or prevent blood clots Thyroid disease An unusual or allergic reaction to venlafaxine, desvenlafaxine, other medications, foods, dyes, or preservatives Pregnant or trying to get pregnant Breast-feeding How should I use this medication? Take this medication by mouth with a glass of water. Follow the directions on the prescription label. Take it with food. Take your medication at regular intervals. Do not take your medication more often than directed. Do not stop taking this medication suddenly except upon the advice of your care team. Stopping this medication too quickly may cause serious side effects or your condition may worsen. A special MedGuide will be given to you by the pharmacist with each prescription and refill. Be sure to read this information carefully each time. Talk to your care team regarding the use of this medication in children. Special care may be needed. Overdosage: If you think you have taken too much of this medicine contact a poison control center or emergency room  at once. NOTE: This medicine is only for you. Do not share this medicine with others. What if I miss a dose? If you miss a dose, take it as soon as you can. If it is almost time for your next dose, take only that dose. Do not take double or extra doses. What may interact with this medication? Do not take this medication with any of the following: Certain medications for fungal infections like fluconazole, itraconazole, ketoconazole, posaconazole, voriconazole Cisapride Desvenlafaxine Dronedarone Duloxetine Levomilnacipran Linezolid MAOIs like Carbex, Eldepryl, Marplan, Nardil, and Parnate Methylene blue (injected into a vein) Milnacipran Pimozide Thioridazine This medication may also interact with the following: Amphetamines Aspirin and aspirin-like medications Certain medications for depression, anxiety, or psychotic disturbances Certain medications for migraine headaches like almotriptan, eletriptan, frovatriptan, naratriptan, rizatriptan, sumatriptan, zolmitriptan Certain medications for sleep Certain medications that treat or prevent blood clots like dalteparin, enoxaparin, warfarin Cimetidine Clozapine Diuretics Fentanyl Furazolidone Indinavir Isoniazid Lithium Metoprolol NSAIDS, medications for pain and inflammation, like ibuprofen or naproxen Other medications that prolong the QT interval (cause an abnormal heart rhythm) like dofetilide, ziprasidone Procarbazine Rasagiline Supplements like St. John's wort, kava kava, valerian Tramadol Tryptophan This list may not describe all possible interactions. Give your health care provider a list of all the medicines, herbs, non-prescription drugs, or dietary supplements you use. Also tell them if you smoke, drink alcohol, or use illegal drugs. Some items may interact with your medicine. What should I watch for while using this medication? Tell your care team if your symptoms do not get better or if they get worse. Visit your  care team for regular checks on your progress. Because it may  take several weeks to see the full effects of this medication, it is important to continue your treatment as prescribed by your care team. Watch for new or worsening thoughts of suicide or depression. This includes sudden changes in mood, behaviors, or thoughts. These changes can happen at any time but are more common in the beginning of treatment or after a change in dose. Call your care team right away if you experience these thoughts or worsening depression. Manic episodes may happen in patients with bipolar disorder who take this medication. Watch for changes in feelings or behaviors such as feeling anxious, nervous, agitated, panicky, irritable, hostile, aggressive, impulsive, severely restless, overly excited and hyperactive, or trouble sleeping. These changes can happen at any time but are more common in the beginning of treatment or after a change in dose. Call your care team right away if you notice any of these symptoms. This medication can cause an increase in blood pressure. Check with your care team for instructions on monitoring your blood pressure while taking this medication. You may get drowsy or dizzy. Do not drive, use machinery, or do anything that needs mental alertness until you know how this medication affects you. Do not stand or sit up quickly, especially if you are an older patient. This reduces the risk of dizzy or fainting spells. Alcohol may interfere with the effect of this medication. Avoid alcoholic drinks. Your mouth may get dry. Chewing sugarless gum, sucking hard candy and drinking plenty of water will help. Contact your care team, if the problem does not go away or is severe. What side effects may I notice from receiving this medication? Side effects that you should report to your care team as soon as possible: Allergic reactions-skin rash, itching, hives, swelling of the face, lips, tongue, or  throat Bleeding-bloody or black, tar-like stools, red or dark brown urine, vomiting blood or brown material that looks like coffee grounds, small, red or purple spots on skin, unusual bleeding or bruising Heart rhythm changes-fast or irregular heartbeat, dizziness, feeling faint or lightheaded, chest pain, trouble breathing Increase in blood pressure Loss of appetite with weight loss Low sodium level-muscle weakness, fatigue, dizziness, headache, confusion Serotonin syndrome-irritability, confusion, fast or irregular heartbeat, muscle stiffness, twitching muscles, sweating, high fever, seizures, chills, vomiting, diarrhea Sudden eye pain or change in vision such as blurry vision, seeing halos around lights, vision loss Thoughts of suicide or self-harm, worsening mood, feelings of depression Side effects that usually do not require medical attention (report to your care team if they continue or are bothersome): Anxiety, nervousness Change in sex drive or performance Dizziness Dry mouth Excessive sweating Nausea Tremors or shaking Trouble sleeping This list may not describe all possible side effects. Call your doctor for medical advice about side effects. You may report side effects to FDA at 1-800-FDA-1088. Where should I keep my medication? Keep out of the reach of children and pets. Store at a controlled temperature between 20 and 25 degrees C (68 and 77 degrees F), in a dry place. Throw away any unused medication after the expiration date. NOTE: This sheet is a summary. It may not cover all possible information. If you have questions about this medicine, talk to your doctor, pharmacist, or health care provider.  2022 Elsevier/Gold Standard (2020-07-05 17:16:17)

## 2021-04-05 ENCOUNTER — Other Ambulatory Visit (HOSPITAL_COMMUNITY): Payer: Self-pay | Admitting: Family

## 2021-04-05 ENCOUNTER — Ambulatory Visit (HOSPITAL_COMMUNITY)
Admission: RE | Admit: 2021-04-05 | Discharge: 2021-04-05 | Disposition: A | Discharge status: Home / Self Care | Payer: 59 | Source: Ambulatory Visit | Attending: Family | Admitting: Family

## 2021-04-05 ENCOUNTER — Other Ambulatory Visit: Payer: Self-pay

## 2021-04-05 DIAGNOSIS — M25561 Pain in right knee: Secondary | ICD-10-CM | POA: Insufficient documentation

## 2021-04-10 NOTE — Progress Notes (Signed)
[name] [address] [place] [address] Phone: [phone] Subjective:   Rachael [name], am serving as a scribe for Dr. Antoine Primas. This visit occurred during the SARS-CoV-2 public health emergency.  Safety protocols were in place, including screening questions prior to the visit, additional usage of staff PPE, and extensive cleaning of exam room while observing appropriate contact time as indicated for disinfecting solutions.   I'm seeing this patient by the request  of:  Ardith Dark, MD  CC: neck and back pain   [id number]  Rachael [name] is a 35 y.o. female coming in with complaint of back and neck pain. OMT 03/07/2021. Also f/u for R wrist pain and R hip pain. Patient was to read about Effexor. Patient states back and neck pain remain the same. Injured her right knee at work 7 days ago. Sharp pain on medial side. Can reproduce pain when kneeling. Has been taking meloxicam and it's helping a little.  Xray (-)  Medications patient has been prescribed: Meloxicam, Vit D          Reviewed prior external information including notes and imaging from previsou exam, outside providers and external EMR if available.   As well as notes that were available from care everywhere and other healthcare systems.  Past medical history, social, surgical and family history all reviewed in electronic medical record.  No pertanent information unless stated regarding to the chief complaint.   Past Medical History:  Diagnosis Date   ADHD    GERD (gastroesophageal reflux disease)    Migraines     No Known Allergies   Review of Systems:  No headache, visual changes, nausea, vomiting, diarrhea, constipation, dizziness, abdominal pain, skin rash, fevers, chills, night sweats, weight loss, swollen lymph nodes, body aches, joint swelling, chest pain, shortness of breath, mood changes. POSITIVE muscle aches  Objective  Blood pressure 104/60, pulse  67, height 5\' 2"  (1.575 m), weight 144 lb (65.3 kg), SpO2 98 %.   General: No apparent distress alert and oriented x3 mood and affect normal, dressed appropriately.  HEENT: Pupils equal, extraocular movements intact  Respiratory: Patient's speak in full sentences and does not appear short of breath  Cardiovascular: No lower extremity edema, non tender, no erythema  Patient does have tightness in the neck noted but seems to be more on the left occipital area.  Patient does have tightness of the hip flexors bilaterally as well.  Seems to be more right greater than left.  Mild positive on the right.  Right knee exam shows some mild tenderness over the medial joint line but more pain over the pes anserine area.  No significant instability but mild positive McMurray's.  Limited muscular skeletal ultrasound was performed and interpreted by Pearlean Brownie, M  Limited ultrasound of patient's right knee shows that patient does have nonspecific cyst noted inferior to the patella tendon.  Patient also has what appears to be mild intrasubstance tearing of the hamstring tendon at the Pes anserine area.  Mild increase in Doppler flow Impression: Pes anserine bursitis with hamstring tendinopathy  Osteopathic findings  C2 flexed rotated and side bent left C6 flexed rotated and side bent right T3 extended rotated and side bent left inhaled rib L2 flexed rotated and side bent right Sacrum right on right       Assessment and Plan:  Cervicogenic headache Patient does have cervicogenic headaches but overall doing relatively well.  Did have more tightness actually  on the left side of the occipital region today.  Discussed posture and ergonomics.  Discussed which activities to doing which wants to avoid.  Follow-up again in 6 to 8 weeks.  Pes anserine bursitis Patient does have more of a what it seems to be more of a hamstring tendinopathy.  Patient does not have any signs on ultrasound that shows any  meniscal injury but patient does have some mild pain with twisting.  We will continue to monitor.  We will do thigh compression, heel lift, icing regimen.  Patient given the exercises to do on her own and follow-up with me again in 4 to 6 weeks   Nonallopathic problems  Decision today to treat with OMT was based on Physical Exam  After verbal consent patient was treated with HVLA, ME, FPR techniques in cervical, rib, thoracic, lumbar, and sacral  areas  Patient tolerated the procedure well with improvement in symptoms  Patient given exercises, stretches and lifestyle modifications  See medications in patient instructions if given  Patient will follow up in 4-8 weeks      The above documentation has been reviewed and is accurate and complete [name], DO       Note: This dictation was prepared with Dragon dictation along with smaller phrase technology. Any transcriptional errors that result from this process are unintentional.

## 2021-04-11 ENCOUNTER — Encounter: Payer: Self-pay | Admitting: Family Medicine

## 2021-04-11 ENCOUNTER — Other Ambulatory Visit: Payer: Self-pay

## 2021-04-11 ENCOUNTER — Ambulatory Visit (INDEPENDENT_AMBULATORY_CARE_PROVIDER_SITE_OTHER): Payer: 59 | Admitting: Family Medicine

## 2021-04-11 VITALS — BP 104/60 | HR 67 | Ht 62.0 in | Wt 144.0 lb

## 2021-04-11 DIAGNOSIS — M9903 Segmental and somatic dysfunction of lumbar region: Secondary | ICD-10-CM | POA: Diagnosis not present

## 2021-04-11 DIAGNOSIS — M7050 Other bursitis of knee, unspecified knee: Secondary | ICD-10-CM | POA: Diagnosis not present

## 2021-04-11 DIAGNOSIS — G4486 Cervicogenic headache: Secondary | ICD-10-CM

## 2021-04-11 DIAGNOSIS — M9902 Segmental and somatic dysfunction of thoracic region: Secondary | ICD-10-CM | POA: Diagnosis not present

## 2021-04-11 DIAGNOSIS — M9901 Segmental and somatic dysfunction of cervical region: Secondary | ICD-10-CM

## 2021-04-11 DIAGNOSIS — M9904 Segmental and somatic dysfunction of sacral region: Secondary | ICD-10-CM

## 2021-04-11 DIAGNOSIS — M9908 Segmental and somatic dysfunction of rib cage: Secondary | ICD-10-CM

## 2021-04-11 NOTE — Assessment & Plan Note (Signed)
Patient does have cervicogenic headaches but overall doing relatively well.  Did have more tightness actually on the left side of the occipital region today.  Discussed posture and ergonomics.  Discussed which activities to doing which wants to avoid.  Follow-up again in 6 to 8 weeks.

## 2021-04-11 NOTE — Patient Instructions (Signed)
Do prescribed exercises at least 3x a week Thigh compression sleeve Ice and Voltaren See you again in 6-8 weeks Hit it big in [place] and remember [place] small people

## 2021-04-11 NOTE — Assessment & Plan Note (Signed)
Patient does have more of a what it seems to be more of a hamstring tendinopathy.  Patient does not have any signs on ultrasound that shows any meniscal injury but patient does have some mild pain with twisting.  We will continue to monitor.  We will do thigh compression, heel lift, icing regimen.  Patient given the exercises to do on her own and follow-up with me again in 4 to 6 weeks

## 2021-04-16 ENCOUNTER — Other Ambulatory Visit (HOSPITAL_COMMUNITY): Payer: Self-pay

## 2021-04-16 MED FILL — Etonogestrel-Ethinyl Estradiol VA Ring 0.12-0.015 MG/24HR: VAGINAL | 84 days supply | Qty: 3 | Fill #2 | Status: AC

## 2021-04-24 ENCOUNTER — Other Ambulatory Visit: Payer: Self-pay

## 2021-04-24 ENCOUNTER — Encounter: Payer: Self-pay | Admitting: Dermatology

## 2021-04-24 ENCOUNTER — Ambulatory Visit (INDEPENDENT_AMBULATORY_CARE_PROVIDER_SITE_OTHER): Payer: 59 | Admitting: Dermatology

## 2021-04-24 DIAGNOSIS — L739 Follicular disorder, unspecified: Secondary | ICD-10-CM

## 2021-04-24 DIAGNOSIS — Z8616 Personal history of COVID-19: Secondary | ICD-10-CM | POA: Diagnosis not present

## 2021-04-24 DIAGNOSIS — L650 Telogen effluvium: Secondary | ICD-10-CM

## 2021-04-24 MED ORDER — CLOBETASOL PROPIONATE 0.05 % EX FOAM: Freq: Two times a day (BID) | CUTANEOUS | 0 refills | Status: DC

## 2021-04-24 NOTE — Patient Instructions (Addendum)
-  Frontal fibrosing alopecia (FFA) is a condition that causes hair loss on the front and sides of your scalp. Hair loss may also occur on eyebrows, eyelashes and other body parts. An autoimmune reaction, genetics or hormones may cause FFA. Healthcare providers diagnose FFA with a physical exam and skin biopsy.   -Telogen effluvium is one of the most common causes of alopecia. It is a scalp disorder characterized by excessive shedding of hair. Several factors such as drugs, trauma, and emotional and physiological stress can lead to the development of telogen effluvium.

## 2021-05-09 ENCOUNTER — Encounter: Payer: Self-pay | Admitting: Dermatology

## 2021-05-09 NOTE — Progress Notes (Signed)
   New Patient   Subjective  Rachael [name] is a 35 y.o. female who presents for the following: Alopecia (Last 2 years- 50% hairloss and scalp throbs & burns tx- otc products).  Hair loss for perhaps 2 years, more severe in past year. Location:  Duration:  Quality:  Associated Signs/Symptoms: Modifying Factors:  Severity:  Timing: Context:    The following portions of the chart were reviewed this encounter and updated as appropriate:  Tobacco  Allergies  Meds  Problems  Med Hx  Surg Hx  Fam Hx      Objective  Well appearing patient in no apparent distress; mood and affect are within normal limits. Head I respect the patient's opinion that there has been a 50% hair loss, but objectively there is perhaps 10% decreased hair density with perhaps greater loss frontally.  No follicular dropout, no miniaturization.  Hair plaque negative.  The hair loss seems to have get worse several months after having COVID, so there is a likelihood of telogen effluvium secondary to coronavirus infections (recently reported as being rather common).  Head Although objectively I cannot appreciate significant inflammation (erythema, edema, pustules, vesicles, scar), however reproducible history of scalp symptoms could indicate subclinical inflammation (versus dysesthesia).  It seems reasonable to try a potent anti-inflammatory for at least 2 months.    A focused examination was performed including scalp, face, ears, neck, hands, nails. Relevant physical exam findings are noted in the Assessment and Plan.   Assessment & Plan  Telogen effluvium Head  Hair pull test negative and coupon given for good rx less than 40$. Also discussed consult with Dr Isaac Laud, lupus, Frontal fibrosing alopecia (FFA)   clobetasol (OLUX) 0.05 % topical foam - Head Apply topically 2 (two) times daily.  Folliculitis Head  Topical clobetasol daily for 8 to 10 weeks.  Recheck at that time.

## 2021-05-20 ENCOUNTER — Encounter: Payer: Self-pay | Admitting: *Deleted

## 2021-05-22 ENCOUNTER — Ambulatory Visit: Payer: 59 | Admitting: Family Medicine

## 2021-05-27 ENCOUNTER — Ambulatory Visit: Payer: 59 | Admitting: Adult Health

## 2021-05-27 ENCOUNTER — Encounter: Payer: Self-pay | Admitting: Adult Health

## 2021-06-24 ENCOUNTER — Telehealth: Payer: 59 | Admitting: Physician Assistant

## 2021-06-24 ENCOUNTER — Other Ambulatory Visit (HOSPITAL_COMMUNITY): Payer: Self-pay

## 2021-06-24 DIAGNOSIS — J111 Influenza due to unidentified influenza virus with other respiratory manifestations: Secondary | ICD-10-CM | POA: Diagnosis not present

## 2021-06-24 DIAGNOSIS — R6889 Other general symptoms and signs: Secondary | ICD-10-CM

## 2021-06-24 MED ORDER — OSELTAMIVIR PHOSPHATE 75 MG PO CAPS
75.0000 mg | ORAL_CAPSULE | Freq: Two times a day (BID) | ORAL | 0 refills | Status: DC
  Filled 2021-06-24: qty 10, 5d supply, fill #0

## 2021-06-24 NOTE — Progress Notes (Signed)
E visit for Flu like symptoms   We are sorry that you are not feeling well.  Here is how we plan to help! Based on what you have shared with me it looks like you may have flu-like symptoms that should be watched but do not seem to indicate anti-viral treatment.  Influenza or the flu is   an infection caused by a respiratory virus. The flu virus is highly contagious and persons who did not receive their yearly flu vaccination may catch the flu from close contact.  We have anti-viral medications to treat the viruses that cause this infection. They are not a cure and only shorten the course of the infection. These prescriptions are most effective when they are given within the first 2 days of flu symptoms. Antiviral medication are indicated if you have a high risk of complications from the flu. You should  also consider an antiviral medication if you are in close contact with someone who is at risk. These medications can help patients avoid complications from the flu  but have side effects that you should know. Possible side effects from Tamiflu or oseltamivir include nausea, vomiting, diarrhea, dizziness, headaches, eye redness, sleep problems or other respiratory symptoms. You should not take Tamiflu if you have an allergy to oseltamivir or any to the ingredients in Tamiflu.  Based upon your symptoms and potential risk factors I recommend that you follow the flu symptoms recommendation that I have listed below.  You can take OTC medications of your choice to treat symptoms as needed. These medications include Dayquil/Nyquil, Thera-Flu, Alka Seltzer cold and flu, Mucinex DM, etc. You may also use Chloraseptic spray as needed for the sore throat. Cough drops can be beneficial for cough and sore throat as well.   ANYONE WHO HAS FLU SYMPTOMS SHOULD: Stay home. The flu is highly contagious and going out or to work exposes others! Be sure to drink plenty of fluids. Water is fine as well as fruit  juices, sodas and electrolyte beverages. You may want to stay away from caffeine or alcohol. If you are nauseated, try taking small sips of liquids. How do you know if you are getting enough fluid? Your urine should be a pale yellow or almost colorless. Get rest. Taking a steamy shower or using a humidifier may help nasal congestion and ease sore throat pain. Using a saline nasal spray works much the same way. Cough drops, hard candies and sore throat lozenges may ease your cough. Line up a caregiver. Have someone check on you regularly.   GET HELP RIGHT AWAY IF: You cannot keep down liquids or your medications. You become short of breath Your fell like you are going to pass out or loose consciousness. Your symptoms persist after you have completed your treatment plan MAKE SURE YOU  Understand these instructions. Will watch your condition. Will get help right away if you are not doing well or get worse.  Your e-visit answers were reviewed by a board certified advanced clinical practitioner to complete your personal care plan.  Depending on the condition, your plan could have included both over the counter or prescription medications.  If there is a problem please reply  once you have received a response from your provider.  Your safety is important to [place].  If you have drug allergies check your prescription carefully.    You can use MyChart to ask questions about todays visit, request a non-urgent call back, or ask for a work or school excuse  for 24 hours related to this e-Visit. If it has been greater than 24 hours you will need to follow up with your provider, or enter a new e-Visit to address those concerns.  You will get an e-mail in the next two days asking about your experience.  I hope that your e-visit has been valuable and will speed your recovery. Thank you for using e-visits.  I provided 5 minutes of non face-to-face time during this encounter for chart review and documentation.

## 2021-06-24 NOTE — Addendum Note (Signed)
Addended by: Margaretann Loveless on: 06/24/2021 08:12 AM   Modules accepted: Orders

## 2021-07-10 ENCOUNTER — Ambulatory Visit: Payer: 59 | Admitting: Family Medicine

## 2021-07-31 ENCOUNTER — Ambulatory Visit: Payer: 59 | Admitting: Dermatology

## 2021-08-19 DIAGNOSIS — R634 Abnormal weight loss: Secondary | ICD-10-CM | POA: Diagnosis not present

## 2021-08-29 ENCOUNTER — Other Ambulatory Visit (HOSPITAL_COMMUNITY): Payer: Self-pay

## 2021-08-29 DIAGNOSIS — E02 Subclinical iodine-deficiency hypothyroidism: Secondary | ICD-10-CM | POA: Diagnosis not present

## 2021-08-29 MED ORDER — LEVOTHYROXINE SODIUM 25 MCG PO TABS
25.0000 ug | ORAL_TABLET | Freq: Every day | ORAL | 3 refills | Status: DC
  Filled 2021-08-29: qty 90, 90d supply, fill #0

## 2021-10-10 DIAGNOSIS — Z01419 Encounter for gynecological examination (general) (routine) without abnormal findings: Secondary | ICD-10-CM | POA: Diagnosis not present

## 2021-10-10 DIAGNOSIS — Z124 Encounter for screening for malignant neoplasm of cervix: Secondary | ICD-10-CM | POA: Diagnosis not present

## 2021-10-10 DIAGNOSIS — R5383 Other fatigue: Secondary | ICD-10-CM | POA: Diagnosis not present

## 2021-10-10 DIAGNOSIS — Z6826 Body mass index (BMI) 26.0-26.9, adult: Secondary | ICD-10-CM | POA: Diagnosis not present

## 2021-10-30 ENCOUNTER — Other Ambulatory Visit: Payer: Self-pay | Admitting: Family Medicine

## 2021-10-30 ENCOUNTER — Other Ambulatory Visit (HOSPITAL_COMMUNITY): Payer: Self-pay

## 2021-10-30 MED ORDER — BUPROPION HCL ER (XL) 150 MG PO TB24
300.0000 mg | ORAL_TABLET | Freq: Every day | ORAL | 3 refills | Status: DC
  Filled 2021-10-30: qty 60, 30d supply, fill #0
  Filled 2022-01-14: qty 60, 30d supply, fill #1
  Filled 2022-04-07: qty 60, 30d supply, fill #2

## 2021-10-31 ENCOUNTER — Other Ambulatory Visit (HOSPITAL_COMMUNITY): Payer: Self-pay

## 2021-10-31 MED ORDER — BUPROPION HCL ER (XL) 150 MG PO TB24
150.0000 mg | ORAL_TABLET | Freq: Every day | ORAL | 11 refills | Status: DC
  Filled 2021-10-31 – 2022-10-24 (×3): qty 30, 30d supply, fill #0

## 2021-12-22 IMAGING — DX DG HIP (WITH OR WITHOUT PELVIS) 2-3V*R*
3 series · 3 of 3 positions shown · non-contrast
Comparison: None.

CLINICAL DATA: Chronic right hip pain.  No known injury.

EXAM:
DG HIP (WITH OR WITHOUT PELVIS) 2-3V RIGHT

[pelvis ap]
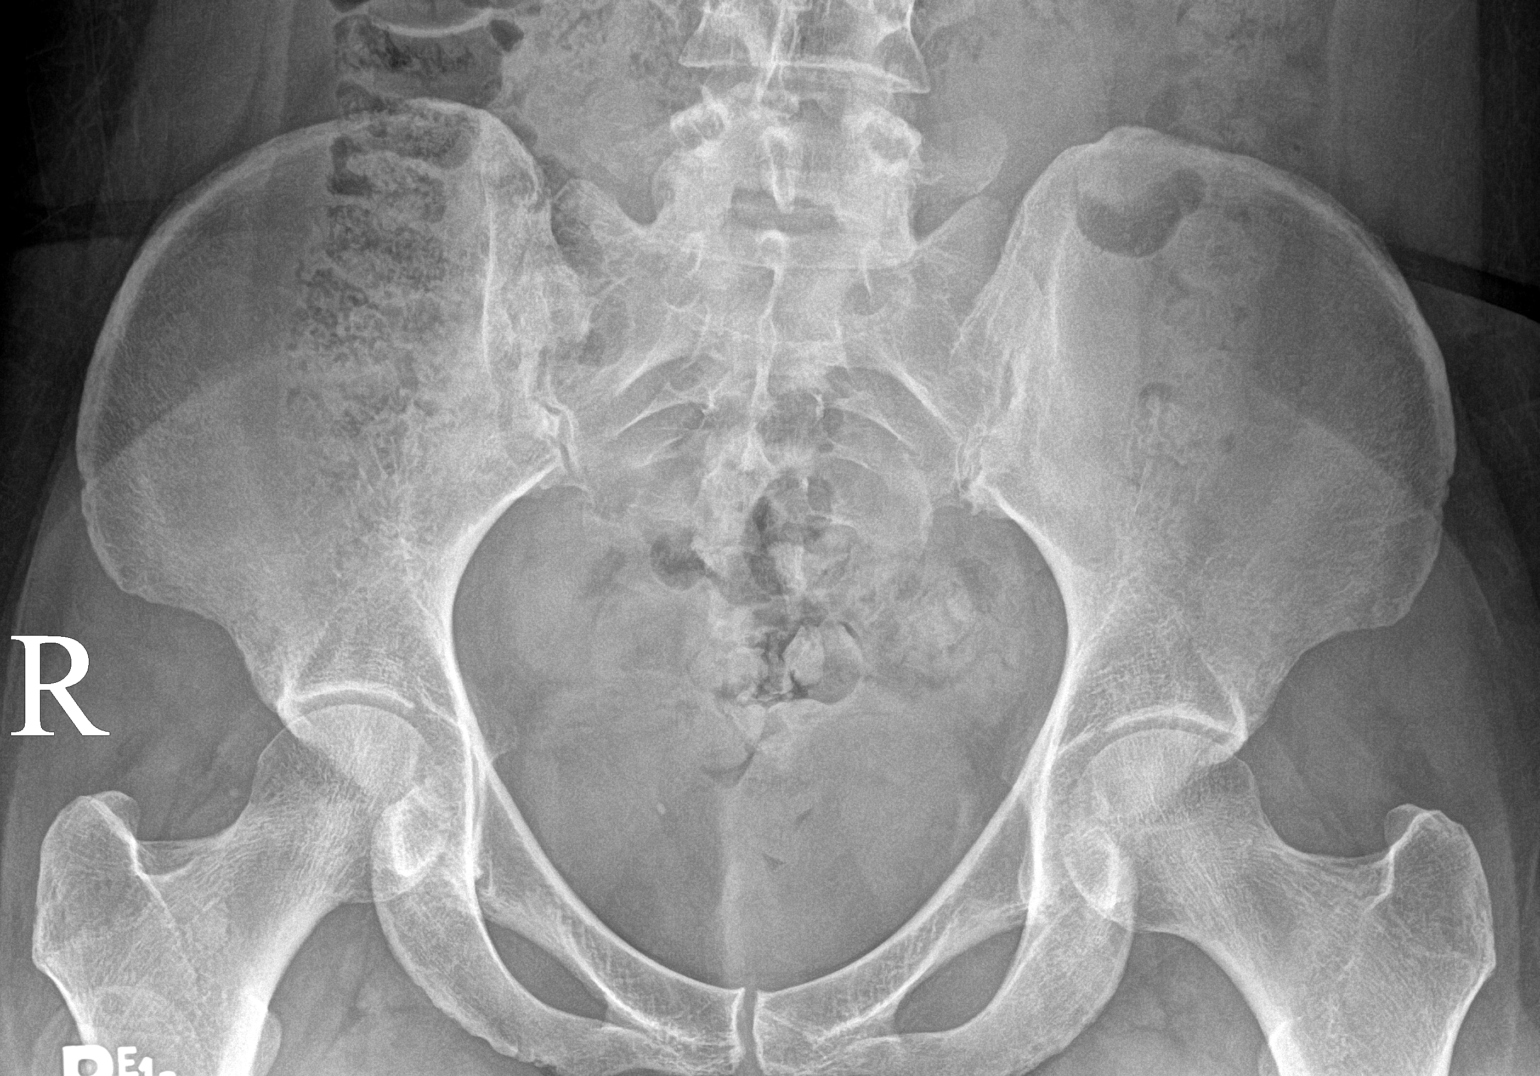

[hip ap]
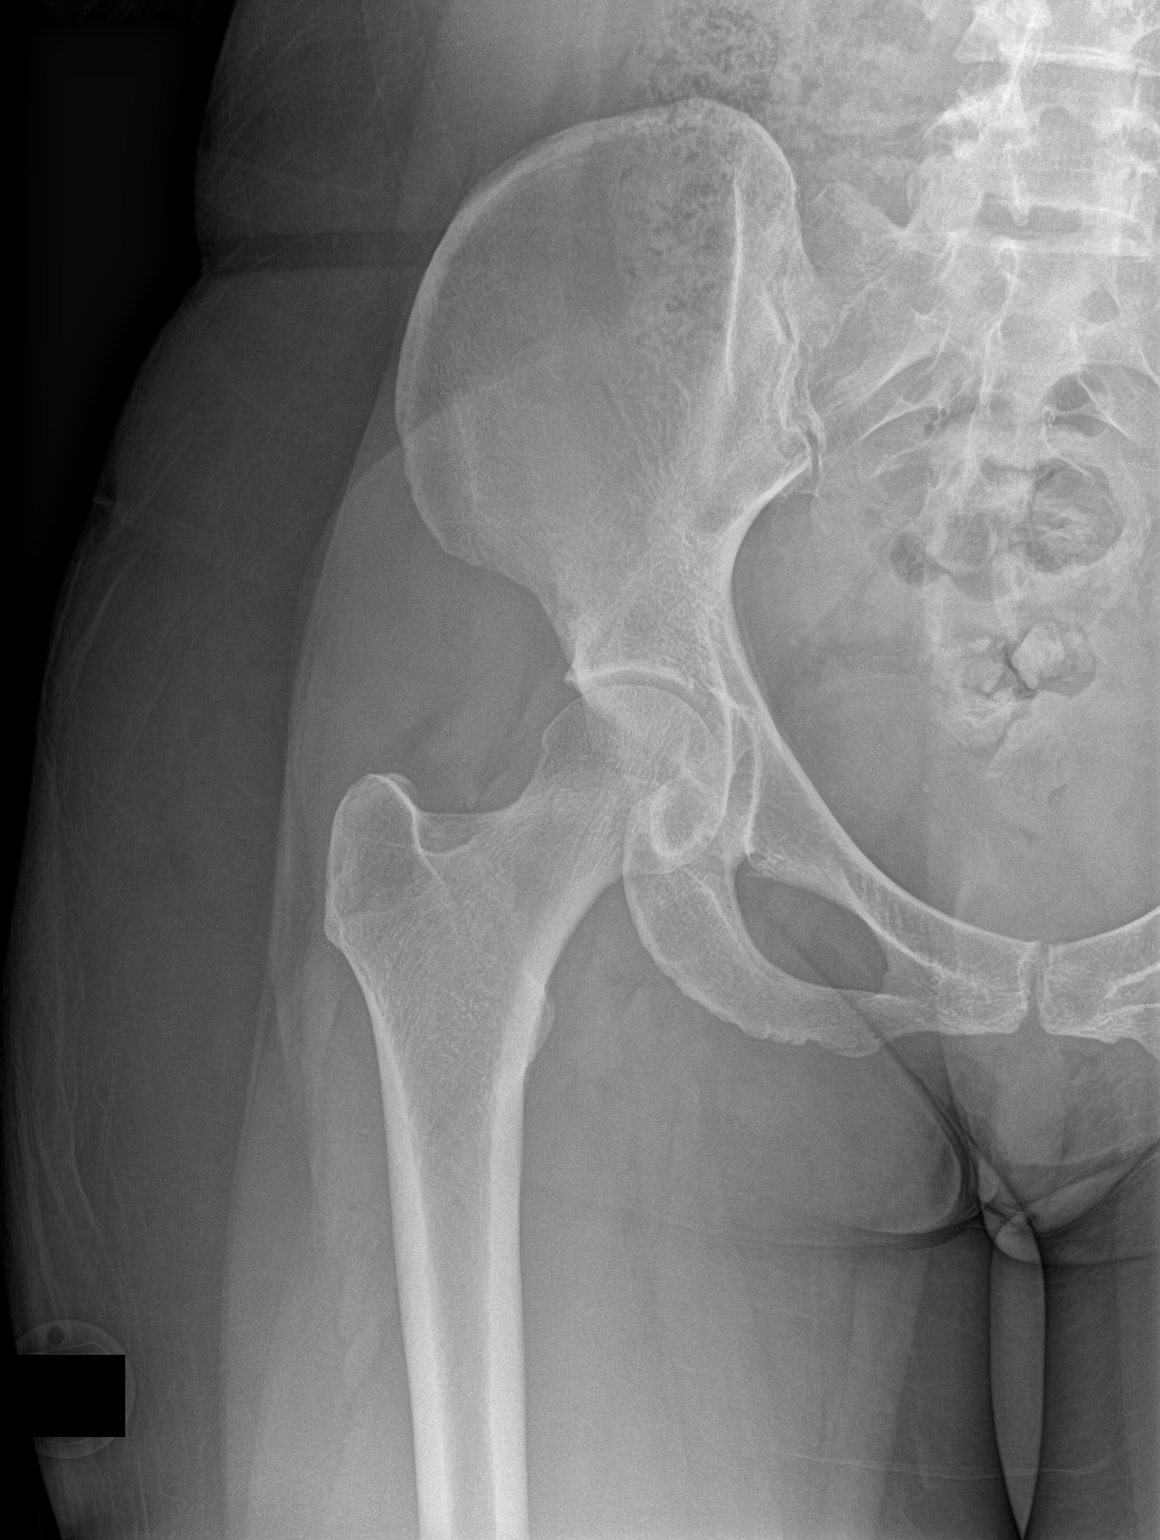

[hip frog leg]
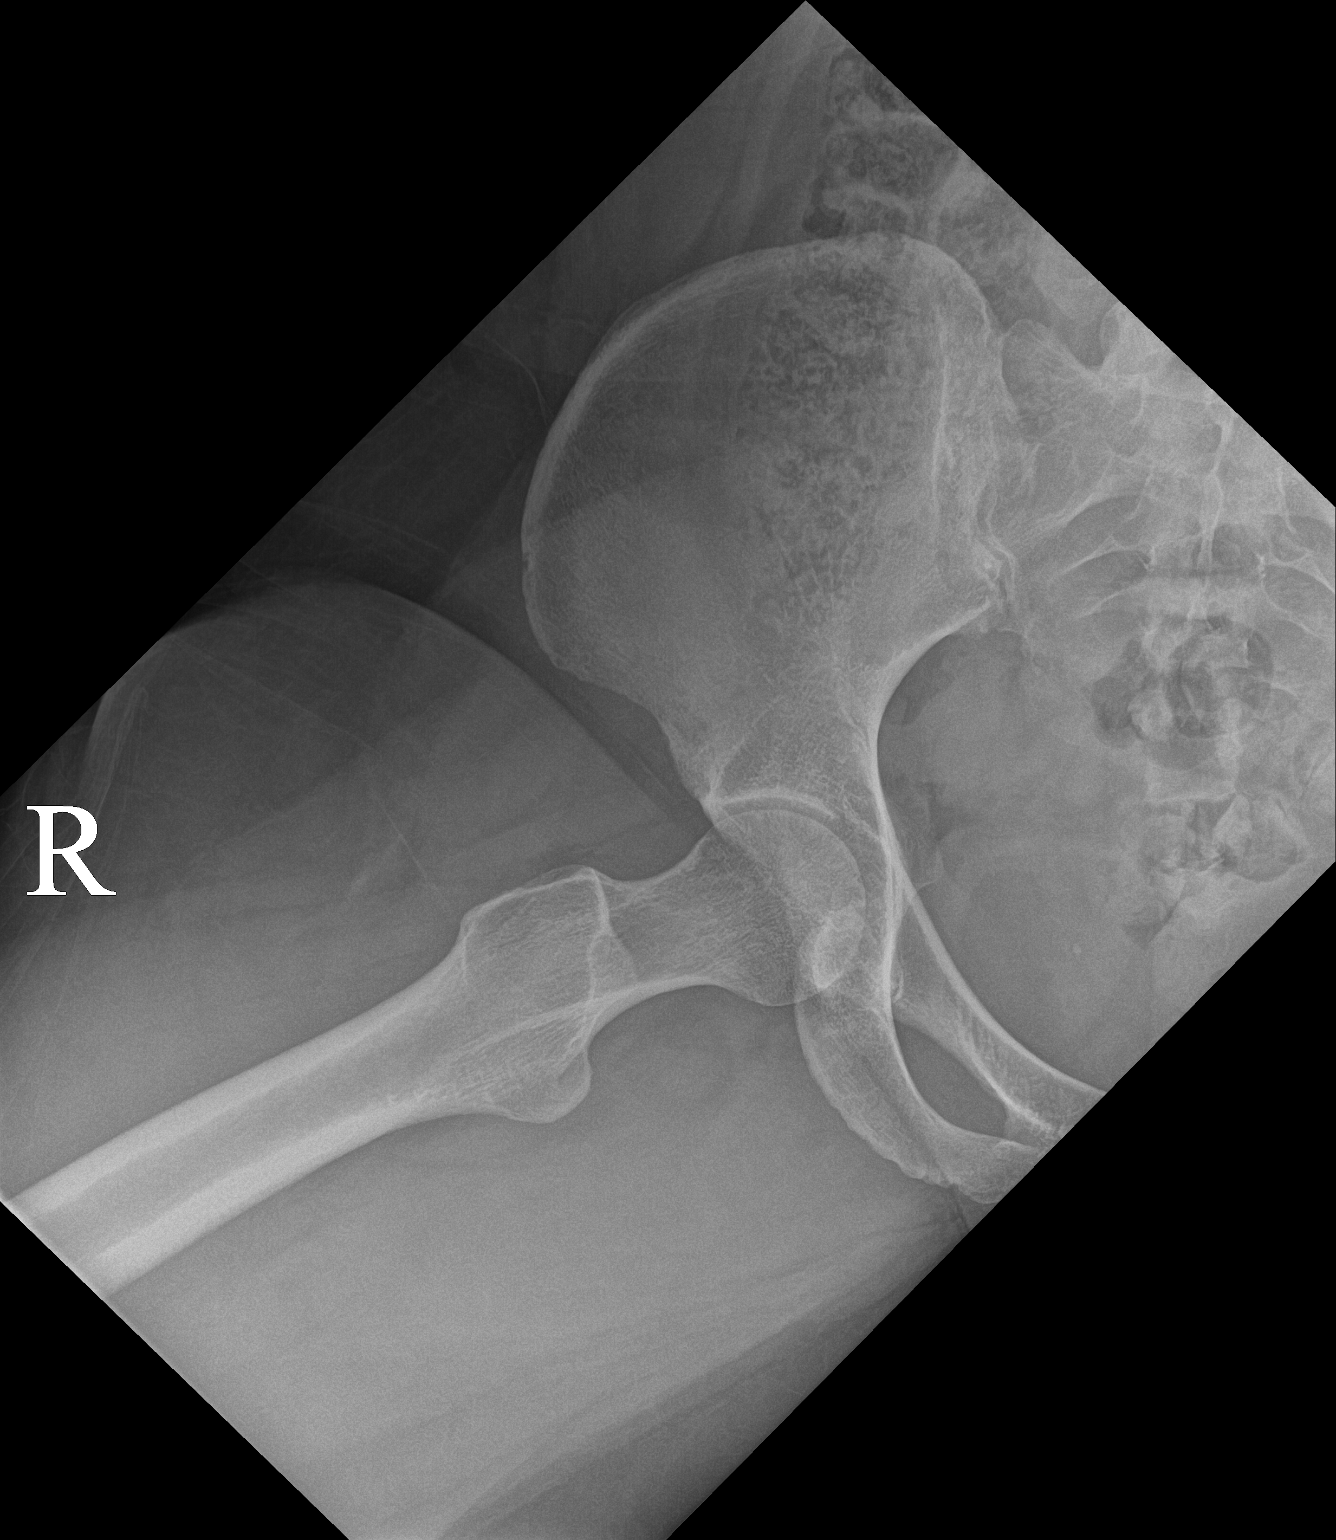

[3 of 3 positions shown; findings below may reference images not displayed]

FINDINGS: The hip joint space is preserved. The femoral head is well seated.
There is no evidence of fracture, erosion, avascular necrosis, or
focal bone lesion. The pubic rami and remainder of the bony pelvis
are intact. The pubic symphysis and sacroiliac joints are congruent
and normal. Regional soft tissues are unremarkable.
IMPRESSION: Negative radiographs of the right hip.  No explanation for pain.

## 2022-01-14 ENCOUNTER — Other Ambulatory Visit (HOSPITAL_COMMUNITY): Payer: Self-pay

## 2022-03-31 ENCOUNTER — Encounter: Payer: Self-pay | Admitting: *Deleted

## 2022-04-01 IMAGING — CR DG KNEE 1-2V*R*
2 series · 2 of 2 positions shown · non-contrast
Comparison: None.

CLINICAL DATA: Right knee pain and swelling

EXAM:
RIGHT KNEE - 1-2 VIEW

[knee ap]
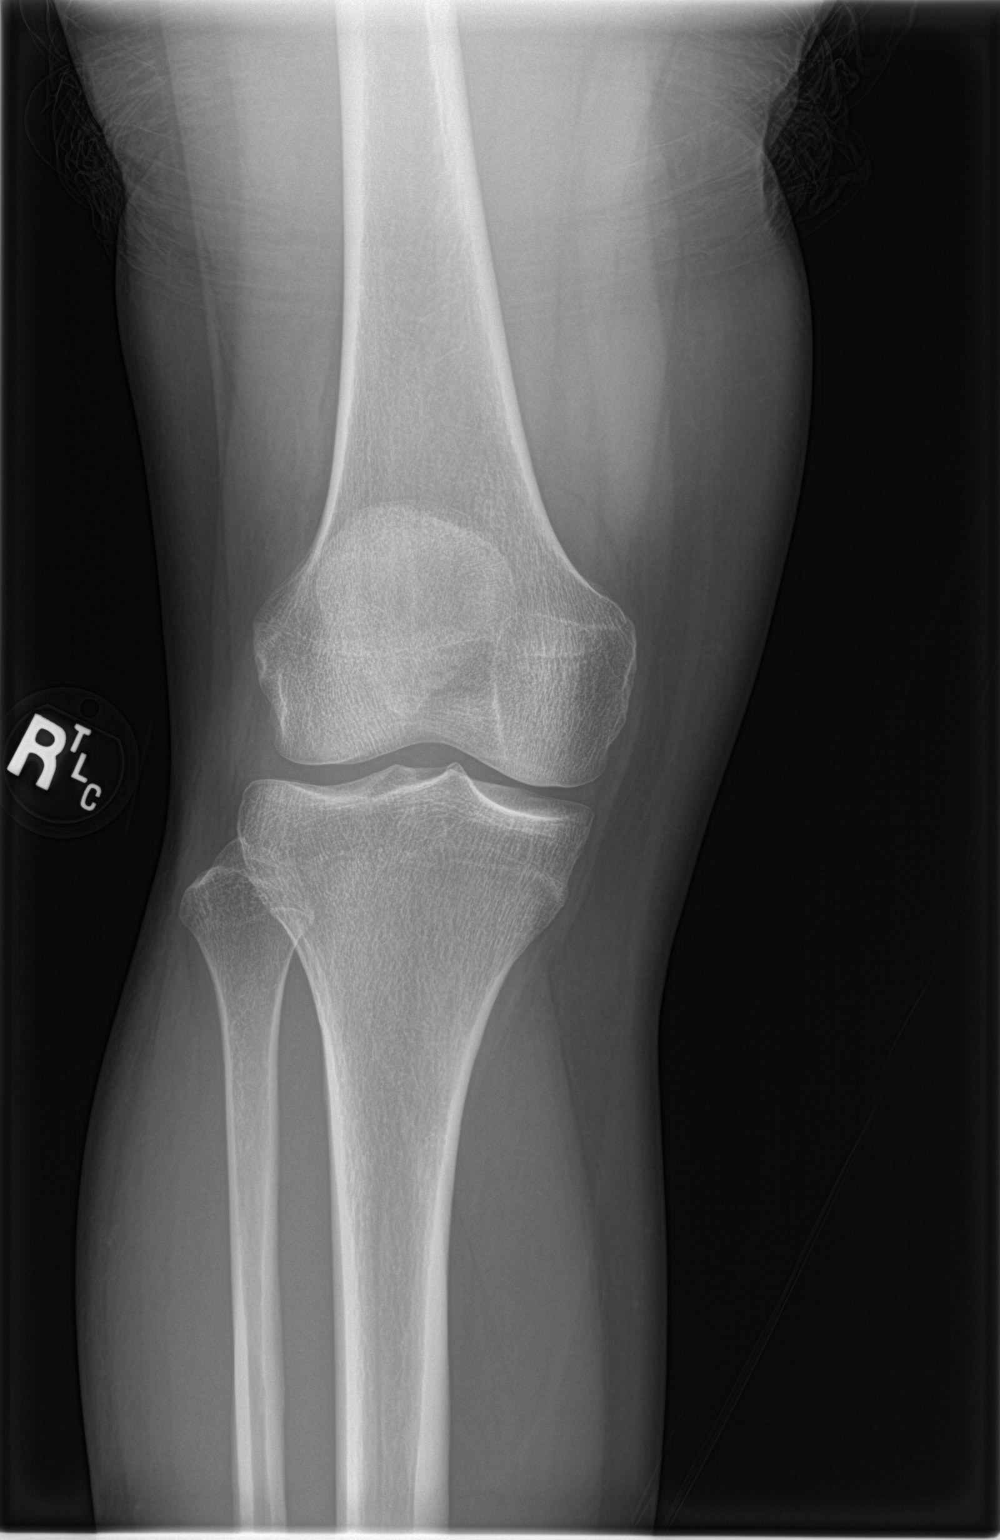

[knee lat]
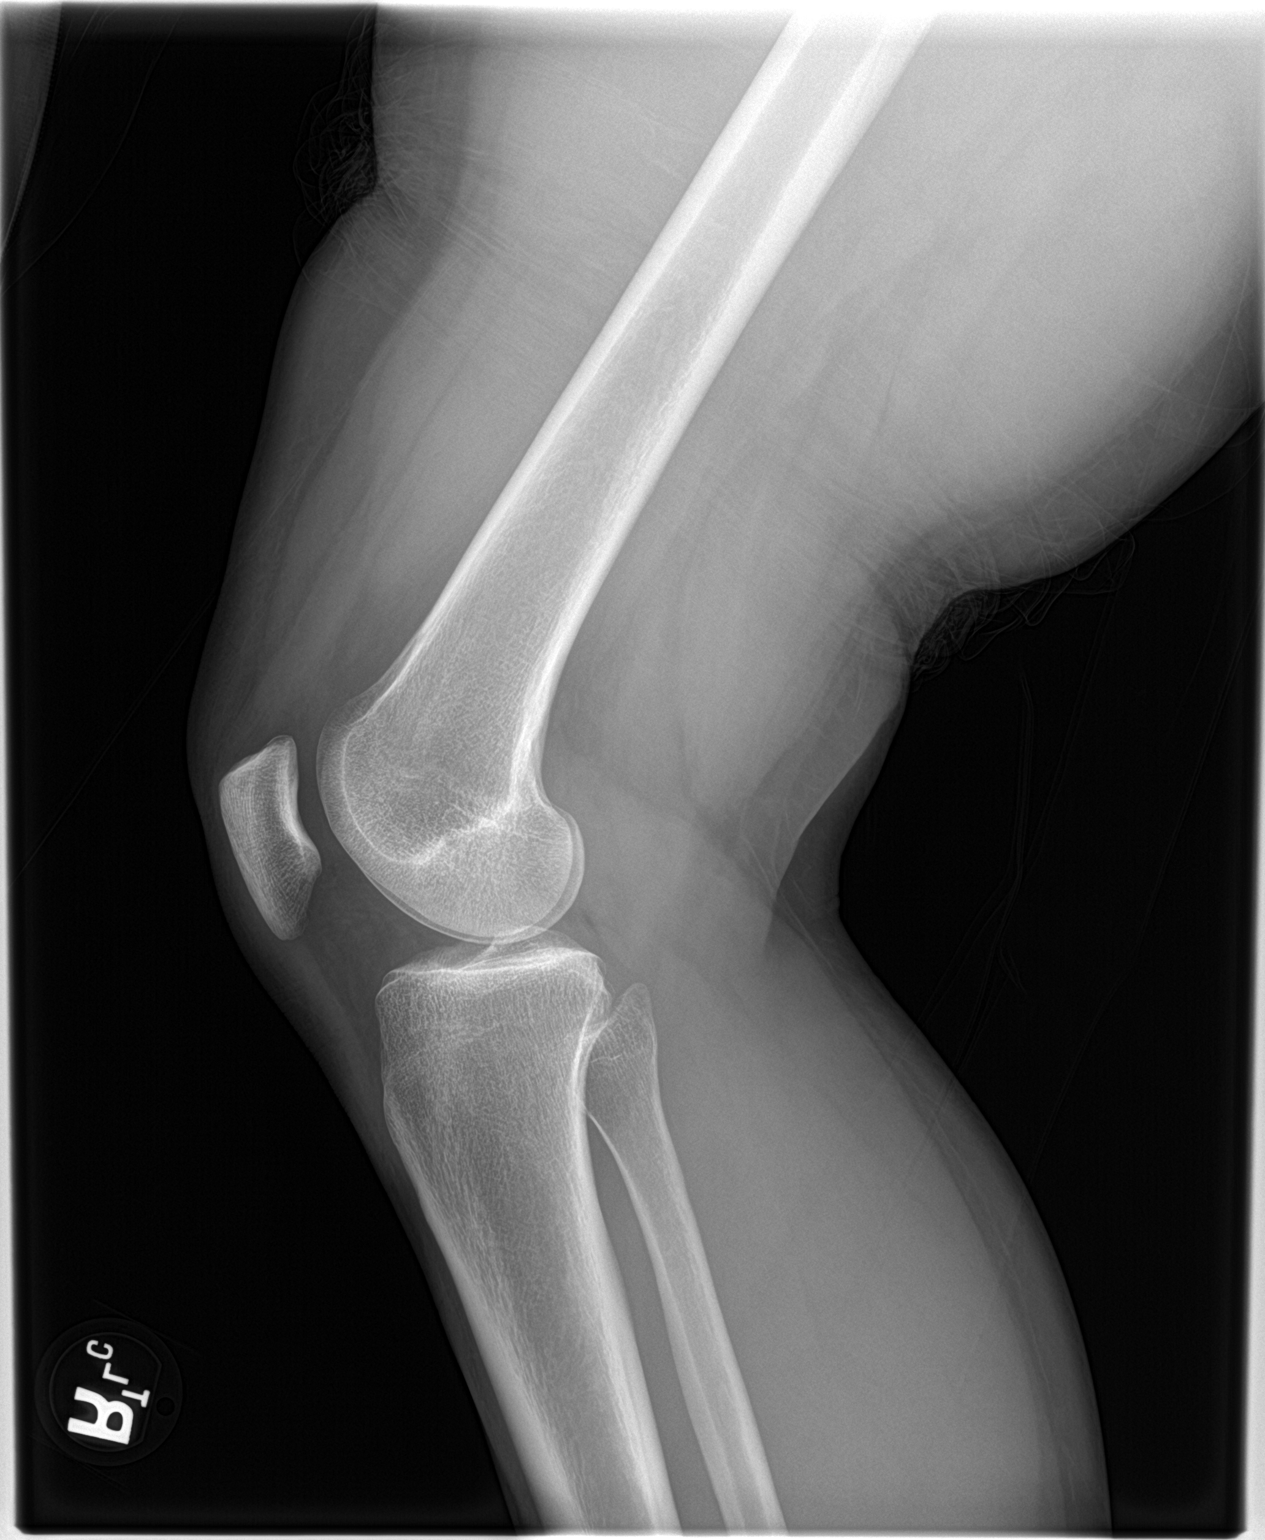

[2 of 2 positions shown; findings below may reference images not displayed]

FINDINGS: No evidence of fracture, dislocation, or joint effusion. No evidence
of arthropathy or other focal bone abnormality. Soft tissues are
unremarkable.
IMPRESSION: No fracture or dislocation of the right knee. Joint spaces are
preserved.

## 2022-04-07 ENCOUNTER — Other Ambulatory Visit (HOSPITAL_COMMUNITY): Payer: Self-pay

## 2022-04-16 DIAGNOSIS — N915 Oligomenorrhea, unspecified: Secondary | ICD-10-CM | POA: Diagnosis not present

## 2022-04-22 ENCOUNTER — Other Ambulatory Visit (HOSPITAL_COMMUNITY): Payer: Self-pay

## 2022-04-22 MED ORDER — CLOMIPHENE CITRATE 50 MG PO TABS
50.0000 mg | ORAL_TABLET | ORAL | 0 refills | Status: DC
  Filled 2022-04-22: qty 5, 5d supply, fill #0

## 2022-04-23 ENCOUNTER — Other Ambulatory Visit (HOSPITAL_COMMUNITY): Payer: Self-pay

## 2022-05-12 DIAGNOSIS — N912 Amenorrhea, unspecified: Secondary | ICD-10-CM | POA: Diagnosis not present

## 2022-05-14 DIAGNOSIS — N912 Amenorrhea, unspecified: Secondary | ICD-10-CM | POA: Diagnosis not present

## 2022-05-22 DIAGNOSIS — N911 Secondary amenorrhea: Secondary | ICD-10-CM | POA: Diagnosis not present

## 2022-06-02 DIAGNOSIS — N911 Secondary amenorrhea: Secondary | ICD-10-CM | POA: Diagnosis not present

## 2022-06-19 ENCOUNTER — Encounter: Payer: Self-pay | Admitting: *Deleted

## 2022-06-26 DIAGNOSIS — Z3400 Encounter for supervision of normal first pregnancy, unspecified trimester: Secondary | ICD-10-CM | POA: Diagnosis not present

## 2022-06-26 DIAGNOSIS — Z3481 Encounter for supervision of other normal pregnancy, first trimester: Secondary | ICD-10-CM | POA: Diagnosis not present

## 2022-06-26 DIAGNOSIS — Z3A10 10 weeks gestation of pregnancy: Secondary | ICD-10-CM | POA: Diagnosis not present

## 2022-06-26 DIAGNOSIS — Z3685 Encounter for antenatal screening for Streptococcus B: Secondary | ICD-10-CM | POA: Diagnosis not present

## 2022-07-02 DIAGNOSIS — Z113 Encounter for screening for infections with a predominantly sexual mode of transmission: Secondary | ICD-10-CM | POA: Diagnosis not present

## 2022-07-02 DIAGNOSIS — Z3A11 11 weeks gestation of pregnancy: Secondary | ICD-10-CM | POA: Diagnosis not present

## 2022-07-02 DIAGNOSIS — O021 Missed abortion: Secondary | ICD-10-CM | POA: Diagnosis not present

## 2022-07-02 DIAGNOSIS — Z3400 Encounter for supervision of normal first pregnancy, unspecified trimester: Secondary | ICD-10-CM | POA: Diagnosis not present

## 2022-07-03 ENCOUNTER — Other Ambulatory Visit (HOSPITAL_COMMUNITY): Payer: Self-pay

## 2022-07-03 MED ORDER — MISOPROSTOL 200 MCG PO TABS
800.0000 ug | ORAL_TABLET | Freq: Two times a day (BID) | ORAL | 0 refills | Status: DC
  Filled 2022-07-03: qty 8, 1d supply, fill #0

## 2022-07-03 MED ORDER — HYDROCODONE-ACETAMINOPHEN 5-325 MG PO TABS
1.0000 | ORAL_TABLET | Freq: Four times a day (QID) | ORAL | 0 refills | Status: DC
  Filled 2022-07-03: qty 10, 3d supply, fill #0

## 2022-07-03 MED ORDER — IBUPROFEN 800 MG PO TABS
800.0000 mg | ORAL_TABLET | Freq: Three times a day (TID) | ORAL | 0 refills | Status: DC
  Filled 2022-07-03: qty 30, 10d supply, fill #0

## 2022-07-31 DIAGNOSIS — O021 Missed abortion: Secondary | ICD-10-CM | POA: Diagnosis not present

## 2022-08-01 ENCOUNTER — Other Ambulatory Visit: Payer: Self-pay

## 2022-08-01 ENCOUNTER — Encounter (HOSPITAL_BASED_OUTPATIENT_CLINIC_OR_DEPARTMENT_OTHER): Payer: Self-pay | Admitting: Obstetrics and Gynecology

## 2022-08-01 NOTE — Progress Notes (Signed)
Spoke w/ via phone for pre-op interview---pt Lab needs dos---- none no orders from Dr. Helane Rima              Lab results------n/a COVID test -----patient states asymptomatic no test needed Arrive at -------0530 NPO after MN NO Solid Food.  Clear liquids from MN until---0430 Med rec completed Medications to take morning of surgery -----Wellbutrin Diabetic medication -----n/a Patient instructed no nail polish to be worn day of surgery Patient instructed to bring photo id and insurance card day of surgery Patient aware to have Driver (ride ) / husband Rachael [name] caregiver    for 24 hours after surgery  Patient Special Instructions -----none Pre-Op special Istructions -----none Patient verbalized understanding of instructions that were given at this phone interview. Patient denies shortness of breath, chest pain, fever, cough at this phone interview.   Orders request from Dr. Helane Rima thru in basket message.

## 2022-08-05 ENCOUNTER — Other Ambulatory Visit: Payer: Self-pay

## 2022-08-05 ENCOUNTER — Encounter (HOSPITAL_BASED_OUTPATIENT_CLINIC_OR_DEPARTMENT_OTHER): Payer: Self-pay | Admitting: Obstetrics and Gynecology

## 2022-08-05 ENCOUNTER — Ambulatory Visit (HOSPITAL_BASED_OUTPATIENT_CLINIC_OR_DEPARTMENT_OTHER): Payer: 59 | Admitting: Certified Registered"

## 2022-08-05 ENCOUNTER — Encounter (HOSPITAL_BASED_OUTPATIENT_CLINIC_OR_DEPARTMENT_OTHER)
Admission: RE | Disposition: A | Discharge status: Home / Self Care | Payer: Self-pay | Source: Home / Self Care | Attending: Obstetrics and Gynecology

## 2022-08-05 ENCOUNTER — Ambulatory Visit (HOSPITAL_BASED_OUTPATIENT_CLINIC_OR_DEPARTMENT_OTHER)
Admission: RE | Admit: 2022-08-05 | Discharge: 2022-08-05 | Disposition: A | Discharge status: Home / Self Care | Payer: 59 | Attending: Obstetrics and Gynecology | Admitting: Obstetrics and Gynecology

## 2022-08-05 DIAGNOSIS — K219 Gastro-esophageal reflux disease without esophagitis: Secondary | ICD-10-CM | POA: Diagnosis not present

## 2022-08-05 DIAGNOSIS — Z793 Long term (current) use of hormonal contraceptives: Secondary | ICD-10-CM | POA: Insufficient documentation

## 2022-08-05 DIAGNOSIS — O039 Complete or unspecified spontaneous abortion without complication: Secondary | ICD-10-CM

## 2022-08-05 DIAGNOSIS — Z3A01 Less than 8 weeks gestation of pregnancy: Secondary | ICD-10-CM

## 2022-08-05 DIAGNOSIS — Z79899 Other long term (current) drug therapy: Secondary | ICD-10-CM | POA: Insufficient documentation

## 2022-08-05 DIAGNOSIS — O021 Missed abortion: Secondary | ICD-10-CM | POA: Diagnosis not present

## 2022-08-05 DIAGNOSIS — O034 Incomplete spontaneous abortion without complication: Secondary | ICD-10-CM | POA: Diagnosis not present

## 2022-08-05 DIAGNOSIS — F909 Attention-deficit hyperactivity disorder, unspecified type: Secondary | ICD-10-CM | POA: Insufficient documentation

## 2022-08-05 DIAGNOSIS — Z6790 Unspecified blood type, Rh positive: Secondary | ICD-10-CM | POA: Diagnosis not present

## 2022-08-05 HISTORY — PX: DILATION AND EVACUATION: SHX1459

## 2022-08-05 LAB — CBC
HCT: 42 % (ref 36.0–46.0)
Hemoglobin: 14.2 g/dL (ref 12.0–15.0)
MCH: 29.5 pg (ref 26.0–34.0)
MCHC: 33.8 g/dL (ref 30.0–36.0)
MCV: 87.1 fL (ref 80.0–100.0)
Platelets: 251 10*3/uL (ref 150–400)
RBC: 4.82 MIL/uL (ref 3.87–5.11)
RDW: 12.2 % (ref 11.5–15.5)
WBC: 4.8 10*3/uL (ref 4.0–10.5)
nRBC: 0 % (ref 0.0–0.2)

## 2022-08-05 SURGERY — DILATION AND EVACUATION, UTERUS
Anesthesia: General | Site: Uterus

## 2022-08-05 MED ORDER — PROPOFOL 10 MG/ML IV BOLUS
INTRAVENOUS | Status: AC
  Filled 2022-08-05: qty 20

## 2022-08-05 MED ORDER — PHENYLEPHRINE 80 MCG/ML (10ML) SYRINGE FOR IV PUSH (FOR BLOOD PRESSURE SUPPORT)
PREFILLED_SYRINGE | INTRAVENOUS | Status: AC
  Filled 2022-08-05: qty 10

## 2022-08-05 MED ORDER — MEPERIDINE HCL 25 MG/ML IJ SOLN: 6.2500 mg | INTRAMUSCULAR | Status: DC | PRN

## 2022-08-05 MED ORDER — KETOROLAC TROMETHAMINE 30 MG/ML IJ SOLN
INTRAMUSCULAR | Status: DC | PRN
  Administered 2022-08-05: 30 mg via INTRAVENOUS

## 2022-08-05 MED ORDER — ONDANSETRON HCL 4 MG/2ML IJ SOLN
INTRAMUSCULAR | Status: DC | PRN
  Administered 2022-08-05: 4 mg via INTRAVENOUS

## 2022-08-05 MED ORDER — SODIUM CHLORIDE 0.9 % IV SOLN
INTRAVENOUS | Status: AC
  Filled 2022-08-05: qty 2

## 2022-08-05 MED ORDER — TRANEXAMIC ACID-NACL 1000-0.7 MG/100ML-% IV SOLN
INTRAVENOUS | Status: AC
  Filled 2022-08-05: qty 100

## 2022-08-05 MED ORDER — PROPOFOL 500 MG/50ML IV EMUL
INTRAVENOUS | Status: AC
  Filled 2022-08-05: qty 50

## 2022-08-05 MED ORDER — PROPOFOL 500 MG/50ML IV EMUL
INTRAVENOUS | Status: DC | PRN
  Administered 2022-08-05: 200 ug/kg/min via INTRAVENOUS

## 2022-08-05 MED ORDER — PHENYLEPHRINE 80 MCG/ML (10ML) SYRINGE FOR IV PUSH (FOR BLOOD PRESSURE SUPPORT)
PREFILLED_SYRINGE | INTRAVENOUS | Status: DC | PRN
  Administered 2022-08-05: 80 ug via INTRAVENOUS
  Administered 2022-08-05: 160 ug via INTRAVENOUS

## 2022-08-05 MED ORDER — FENTANYL CITRATE (PF) 100 MCG/2ML IJ SOLN
INTRAMUSCULAR | Status: DC | PRN
  Administered 2022-08-05: 50 ug via INTRAVENOUS

## 2022-08-05 MED ORDER — ACETAMINOPHEN 500 MG PO TABS
1000.0000 mg | ORAL_TABLET | Freq: Once | ORAL | Status: AC
  Administered 2022-08-05: 1000 mg via ORAL

## 2022-08-05 MED ORDER — POVIDONE-IODINE 10 % EX SWAB
2.0000 | Freq: Once | CUTANEOUS | Status: DC
Start: 2022-08-05 — End: 2022-08-05

## 2022-08-05 MED ORDER — AMISULPRIDE (ANTIEMETIC) 5 MG/2ML IV SOLN: 10.0000 mg | Freq: Once | INTRAVENOUS | Status: DC | PRN

## 2022-08-05 MED ORDER — MIDAZOLAM HCL 2 MG/2ML IJ SOLN
INTRAMUSCULAR | Status: AC
  Filled 2022-08-05: qty 2

## 2022-08-05 MED ORDER — LACTATED RINGERS IV SOLN: INTRAVENOUS | Status: DC

## 2022-08-05 MED ORDER — SODIUM CHLORIDE 0.9 % IV SOLN
2.0000 g | INTRAVENOUS | Status: AC
  Administered 2022-08-05: 2 g via INTRAVENOUS

## 2022-08-05 MED ORDER — HYDROMORPHONE HCL 1 MG/ML IJ SOLN: 0.2500 mg | INTRAMUSCULAR | Status: DC | PRN

## 2022-08-05 MED ORDER — LIDOCAINE HCL (PF) 2 % IJ SOLN
INTRAMUSCULAR | Status: AC
  Filled 2022-08-05: qty 5

## 2022-08-05 MED ORDER — PROPOFOL 10 MG/ML IV BOLUS
INTRAVENOUS | Status: DC | PRN
  Administered 2022-08-05: 200 mg via INTRAVENOUS

## 2022-08-05 MED ORDER — ONDANSETRON HCL 4 MG/2ML IJ SOLN
INTRAMUSCULAR | Status: AC
  Filled 2022-08-05: qty 2

## 2022-08-05 MED ORDER — KETOROLAC TROMETHAMINE 30 MG/ML IJ SOLN: 30.0000 mg | Freq: Once | INTRAMUSCULAR | Status: DC | PRN

## 2022-08-05 MED ORDER — DEXAMETHASONE SODIUM PHOSPHATE 10 MG/ML IJ SOLN
INTRAMUSCULAR | Status: AC
  Filled 2022-08-05: qty 1

## 2022-08-05 MED ORDER — LIDOCAINE HCL 1 % IJ SOLN
INTRAMUSCULAR | Status: DC | PRN
  Administered 2022-08-05: 10 mL

## 2022-08-05 MED ORDER — MIDAZOLAM HCL 5 MG/5ML IJ SOLN
INTRAMUSCULAR | Status: DC | PRN
  Administered 2022-08-05 (×2): 1 mg via INTRAVENOUS

## 2022-08-05 MED ORDER — ACETAMINOPHEN 500 MG PO TABS
ORAL_TABLET | ORAL | Status: AC
  Filled 2022-08-05: qty 2

## 2022-08-05 MED ORDER — 0.9 % SODIUM CHLORIDE (POUR BTL) OPTIME
TOPICAL | Status: DC | PRN
  Administered 2022-08-05: 500 mL

## 2022-08-05 MED ORDER — LIDOCAINE 2% (20 MG/ML) 5 ML SYRINGE
INTRAMUSCULAR | Status: DC | PRN
  Administered 2022-08-05: 60 mg via INTRAVENOUS

## 2022-08-05 MED ORDER — OXYCODONE HCL 5 MG/5ML PO SOLN: 5.0000 mg | Freq: Once | ORAL | Status: DC | PRN

## 2022-08-05 MED ORDER — ONDANSETRON HCL 4 MG/2ML IJ SOLN: 4.0000 mg | Freq: Once | INTRAMUSCULAR | Status: DC | PRN

## 2022-08-05 MED ORDER — FENTANYL CITRATE (PF) 100 MCG/2ML IJ SOLN
INTRAMUSCULAR | Status: AC
  Filled 2022-08-05: qty 2

## 2022-08-05 MED ORDER — DEXAMETHASONE SODIUM PHOSPHATE 10 MG/ML IJ SOLN
INTRAMUSCULAR | Status: DC | PRN
  Administered 2022-08-05: 10 mg via INTRAVENOUS

## 2022-08-05 MED ORDER — OXYCODONE HCL 5 MG PO TABS: 5.0000 mg | ORAL_TABLET | Freq: Once | ORAL | Status: DC | PRN

## 2022-08-05 SURGICAL SUPPLY — 21 items
CATH ROBINSON RED A/P 16FR (CATHETERS) ×1 IMPLANT
DRSG TELFA 3X8 NADH STRL (GAUZE/BANDAGES/DRESSINGS) ×1 IMPLANT
FILTER UTR ASPR ASSEMBLY (MISCELLANEOUS) ×1 IMPLANT
GAUZE 4X4 16PLY ~~LOC~~+RFID DBL (SPONGE) ×1 IMPLANT
GLOVE BIO SURGEON STRL SZ 6.5 (GLOVE) ×1 IMPLANT
GLOVE BIOGEL PI IND STRL 7.0 (GLOVE) ×1 IMPLANT
GOWN STRL REUS W/TWL LRG LVL3 (GOWN DISPOSABLE) ×1 IMPLANT
HOSE CONNECTING 18IN BERKELEY (TUBING) ×1 IMPLANT
KIT BERKELEY 1ST TRIMESTER 3/8 (MISCELLANEOUS) ×2 IMPLANT
KIT TURNOVER CYSTO (KITS) ×1 IMPLANT
NS IRRIG 500ML POUR BTL (IV SOLUTION) IMPLANT
PACK VAGINAL MINOR WOMEN LF (CUSTOM PROCEDURE TRAY) ×1 IMPLANT
PAD OB MATERNITY 4.3X12.25 (PERSONAL CARE ITEMS) ×1 IMPLANT
SET BERKELEY SUCTION TUBING (SUCTIONS) ×1 IMPLANT
SPIKE FLUID TRANSFER (MISCELLANEOUS) ×1 IMPLANT
TOWEL OR 17X26 10 PK STRL BLUE (TOWEL DISPOSABLE) ×1 IMPLANT
UNDERPAD 30X36 HEAVY ABSORB (UNDERPADS AND DIAPERS) ×1 IMPLANT
VACURETTE 10 RIGID CVD (CANNULA) IMPLANT
VACURETTE 7MM CVD STRL WRAP (CANNULA) IMPLANT
VACURETTE 8 RIGID CVD (CANNULA) IMPLANT
VACURETTE 9 RIGID CVD (CANNULA) IMPLANT

## 2022-08-05 NOTE — Op Note (Signed)
NAMECHEVELLE, COULSON MEDICAL RECORD NO: [id number] ACCOUNT NO: [id number] DATE OF BIRTH: July 26, 1985 FACILITY: [facility] LOCATION: [facility] PHYSICIAN: Marjarie Irion L. Helane Rima, MD  Operative Report   DATE OF PROCEDURE: 08/05/2022  PREOPERATIVE DIAGNOSES: Retained products of conception, Rh positive.  POSTOPERATIVE DIAGNOSES: Retained products of conception, Rh positive.  PROCEDURE:  Dilatation and evacuation.  SURGEON:  Dian Queen, MD  ANESTHESIA:  MAC with paracervical block and IV sedation.  ESTIMATED BLOOD LOSS:  Less than 50 mL.  COMPLICATIONS:  None.  DRAINS:  None.  PATHOLOGY: Products of conception.  DESCRIPTION OF PROCEDURE:  The patient was taken to the operating room.  Her consent had been signed.  She was prepped and draped after she was administered anesthesia.  A speculum was inserted into the vagina.  The cervix was grasped with a tenaculum  and a paracervical block was performed.  I could see what looked like maybe little products of conception, kind of stuck at the external os. Sounded the uterus and then gently dilated the cervical os some more, inserted a sharp curette and curetted the  tissue.  I inserted the #7 suction cannula and performed a suction curettage x2 with retrieval of tissue consistent with products of conception.  I inserted tissue forceps and removed what appeared to be the intact sac. Final suction curettage was  performed.  A sharp curette was inserted.  The uterus was cleared and cleaned of all tissue.  No bleeding was noted.  All instruments were removed from the vagina.  The patient tolerated the procedure well.  All sponge, lap and instrument counts were  correct x2.  She went to recovery room in stable condition.   SHW D: 08/05/2022 7:49:10 am T: 08/05/2022 8:42:00 am  JOB: [id number]/ [id number]

## 2022-08-05 NOTE — Transfer of Care (Signed)
Immediate Anesthesia Transfer of Care Note  Patient: Rachael [name]  Procedure(s) Performed: DILATATION AND EVACUATION (Uterus)  Patient Location: PACU  Anesthesia Type:General  Level of Consciousness: awake, alert , oriented, and patient cooperative  Airway & Oxygen Therapy: Patient Spontanous Breathing and Patient connected to nasal cannula oxygen  Post-op Assessment: Report given to RN and Post -op Vital signs reviewed and stable  Post vital signs: Reviewed and stable  Last Vitals:  Vitals Value Taken Time  BP 113/53 08/05/22 0758  Temp    Pulse 80 08/05/22 0758  Resp 15 08/05/22 0758  SpO2 100 % 08/05/22 0758  Vitals shown include unvalidated device data.  Last Pain:  Vitals:   08/05/22 0602  TempSrc: Oral  PainSc: 0-No pain      Patients Stated Pain Goal: 3 (86/38/17 7116)  Complications: No notable events documented.

## 2022-08-05 NOTE — Anesthesia Postprocedure Evaluation (Signed)
Anesthesia Post Note  Patient: HARLEY MCCARTNEY  Procedure(s) Performed: DILATATION AND EVACUATION (Uterus)     Patient location during evaluation: PACU Anesthesia Type: General Level of consciousness: awake and alert, oriented and patient cooperative Pain management: pain level controlled Vital Signs Assessment: post-procedure vital signs reviewed and stable Respiratory status: spontaneous breathing, nonlabored ventilation and respiratory function stable Cardiovascular status: blood pressure returned to baseline and stable Postop Assessment: no apparent nausea or vomiting Anesthetic complications: no   No notable events documented.  Last Vitals:  Vitals:   08/05/22 0800 08/05/22 0815  BP: 108/62 104/71  Pulse: 81 64  Resp: 13 19  Temp:    SpO2: 100% 100%    Last Pain:  Vitals:   08/05/22 0815  TempSrc:   PainSc: 0-No pain                 Pervis Hocking

## 2022-08-05 NOTE — Brief Op Note (Signed)
08/05/2022  7:43 AM  PATIENT:  Rachael [name]  37 y.o. female  PRE-OPERATIVE DIAGNOSIS:  RETAINED PRODUCTS OF CONCEPTION  POST-OPERATIVE DIAGNOSIS:  RETAINED PRODUCTS OF CONCEPTION  PROCEDURE:  Procedure(s): DILATATION AND EVACUATION (N/A)  SURGEON:  Surgeon(s) and Role:    * Dian Queen, MD - Primary  PHYSICIAN ASSISTANT:   ASSISTANTS: none   ANESTHESIA:   IV sedation and MAC  EBL:  per anesthesia record    BLOOD ADMINISTERED:none  DRAINS: none   LOCAL MEDICATIONS USED:  LIDOCAINE   SPECIMEN:  Source of Specimen:  POC  DISPOSITION OF SPECIMEN:  PATHOLOGY  COUNTS:  YES  TOURNIQUET:  * No tourniquets in log *  DICTATION: .Other Dictation: Dictation Number dictated  PLAN OF CARE: Discharge to home after PACU  PATIENT DISPOSITION:  PACU - hemodynamically stable.   Delay start of Pharmacological VTE agent (>24hrs) due to surgical blood loss or risk of bleeding: not applicable

## 2022-08-05 NOTE — Anesthesia Preprocedure Evaluation (Signed)
Anesthesia Evaluation  Patient identified by MRN, date of birth, ID band Patient awake    Reviewed: Allergy & Precautions, H&P , NPO status , Patient's Chart, lab work & pertinent test results  Airway Mallampati: II  TM Distance: >3 FB Neck ROM: Full    Dental no notable dental hx.    Pulmonary neg pulmonary ROS   Pulmonary exam normal breath sounds clear to auscultation       Cardiovascular negative cardio ROS Normal cardiovascular exam Rhythm:Regular Rate:Normal     Neuro/Psych  Headaches  negative psych ROS   GI/Hepatic Neg liver ROS,GERD  Controlled,,  Endo/Other  negative endocrine ROS    Renal/GU negative Renal ROS  negative genitourinary   Musculoskeletal negative musculoskeletal ROS (+)    Abdominal   Peds negative pediatric ROS (+)  Hematology negative hematology ROS (+)   Anesthesia Other Findings   Reproductive/Obstetrics Retained POC                             Anesthesia Physical Anesthesia Plan  ASA: 1  Anesthesia Plan: General   Post-op Pain Management: Tylenol PO (pre-op)* and Toradol IV (intra-op)*   Induction: Intravenous  PONV Risk Score and Plan: 2 and Ondansetron, Dexamethasone, Midazolam and Treatment may vary due to age or medical condition  Airway Management Planned: LMA  Additional Equipment: None  Intra-op Plan:   Post-operative Plan: Extubation in OR  Informed Consent: I have reviewed the patients History and Physical, chart, labs and discussed the procedure including the risks, benefits and alternatives for the proposed anesthesia with the patient or authorized representative who has indicated his/her understanding and acceptance.     Dental advisory given  Plan Discussed with: CRNA  Anesthesia Plan Comments:        Anesthesia Quick Evaluation

## 2022-08-05 NOTE — Anesthesia Procedure Notes (Signed)
Procedure Name: LMA Insertion Date/Time: 08/05/2022 7:27 AM  Performed by: Rogers Blocker, CRNAPre-anesthesia Checklist: Patient identified, Emergency Drugs available, Suction available and Patient being monitored Patient Re-evaluated:Patient Re-evaluated prior to induction Oxygen Delivery Method: Circle System Utilized Preoxygenation: Pre-oxygenation with 100% oxygen Induction Type: IV induction Ventilation: Mask ventilation without difficulty LMA: LMA inserted LMA Size: 3.0 Number of attempts: 1 Airway Equipment and Method: Bite block Placement Confirmation: positive ETCO2 Tube secured with: Tape Dental Injury: Teeth and Oropharynx as per pre-operative assessment

## 2022-08-05 NOTE — H&P (Signed)
37 year old G 1 P 0 with retained PCOC. For D and E, Blood type is o positive.  Past Medical History:  Diagnosis Date   ADHD    GERD (gastroesophageal reflux disease)    Migraines    Past Surgical History:  Procedure Laterality Date   BREAST BIOPSY  2012   COLONOSCOPY  2011   ESOPHAGOGASTRODUODENOSCOPY     2 yrs ago  08/01/2022   Prior to Admission medications   Medication Sig Start Date End Date Taking? Authorizing Provider  buPROPion (WELLBUTRIN XL) 150 MG 24 hr tablet Take 1 tablet (150 mg total) by mouth daily. 10/31/21  Yes   HYDROcodone-acetaminophen (NORCO/VICODIN) 5-325 MG tablet Take 1 tablet by mouth every 6 (six) hours. 07/03/22  Yes   ibuprofen (ADVIL) 200 MG tablet Take 200 mg by mouth every 6 (six) hours as needed.   Yes [provider]  buPROPion (WELLBUTRIN XL) 150 MG 24 hr tablet Take 2 tablets (300 mg total) by mouth daily at bedtime. Patient not taking: Reported on 08/05/2022 10/30/21   Vivi Barrack, MD  clobetasol (OLUX) 0.05 % topical foam Apply topically 2 (two) times daily. Patient not taking: Reported on 08/05/2022 04/24/21   Lavonna Monarch, MD  clomiPHENE (CLOMID) 50 MG tablet Take 1 tablet (50 mg total) by mouth days 5-9 Patient not taking: Reported on 08/05/2022 04/22/22     cyclobenzaprine (FLEXERIL) 10 MG tablet Take 1 tablet (10 mg total) by mouth 3 (three) times daily as needed for muscle spasms. 11/08/20   Vivi Barrack, MD  etonogestrel-ethinyl estradiol (NUVARING) 0.12-0.015 MG/24HR vaginal ring INSERT 1 VAGINAL RING EVERY MONTH AS DIRECTED FOR 21 DAYS Patient not taking: Reported on 08/05/2022 09/05/20 09/05/21  Dian Queen, MD  ibuprofen (ADVIL) 800 MG tablet Take 1 tablet (800 mg total) by mouth 3 (three) times daily. Patient not taking: Reported on 08/05/2022 07/03/22     levothyroxine (SYNTHROID) 25 MCG tablet Take 1 tablet (25 mcg total) by mouth daily. Patient not taking: Reported on 08/05/2022 08/29/21     meloxicam (MOBIC) 7.5 MG tablet  Take 1 tablet (7.5 mg total) by mouth daily. Patient not taking: Reported on 08/05/2022 12/26/20   Lyndal Pulley, DO  misoprostol (CYTOTEC) 200 MCG tablet Take 4 tablets (800 mcg total) by mouth 2 (two) times daily. Patient not taking: Reported on 08/05/2022 07/03/22     oseltamivir (TAMIFLU) 75 MG capsule Take 1 capsule (75 mg total) by mouth 2 (two) times daily. Patient not taking: Reported on 08/05/2022 06/24/21   Mar Daring, PA-C  Rimegepant Sulfate (NURTEC) 75 MG TBDP Take 75 mg by mouth once as needed for up to 1 dose. Patient not taking: Reported on 08/05/2022 02/11/21   Star Age, MD  rizatriptan (MAXALT-MLT) 5 MG disintegrating tablet Take 1 tablet (5 mg total) by mouth as needed for migraine. May repeat in 2 hrs if needed. Max of  2 tabs in 24 hrs and no more than 3 tabs in 1 week. Patient not taking: Reported on 08/05/2022 02/19/21   Star Age, MD  Vitamin D, Ergocalciferol, (DRISDOL) 1.25 MG (50000 UNIT) CAPS capsule Take 1 capsule (50,000 Units total) by mouth every 7 (seven) days. Patient not taking: Reported on 08/05/2022 12/26/20   Lyndal Pulley, DO   Allergies NKDA  Family History  Problem Relation Age of Onset   Migraines Mother    Colon cancer Father    Prostate cancer Maternal Grandfather  dx in 108s   Cancer Maternal Grandfather        colangiocarcinoma   Prostate cancer Maternal Uncle        dx in 37s-50s   Breast cancer Paternal Aunt        dx in 51s   Ovarian cancer Paternal Aunt        dx in 77s   Uterine cancer Paternal Aunt        dx in 79s   Colon cancer Paternal Aunt        dx in 29s   Social History   Socioeconomic History   Marital status: Married    Spouse name: Not on file   Number of children: Not on file   Years of education: Not on file   Highest education level: Not on file  Occupational History   Not on file  Tobacco Use   Smoking status: Never   Smokeless tobacco: Never  Vaping Use   Vaping Use: Never used   Substance and Sexual Activity   Alcohol use: Yes   Drug use: Never   Sexual activity: Not on file  Other Topics Concern   Not on file  Social History Narrative   Not on file   Social Determinants of Health   Financial Resource Strain: Not on file  Food Insecurity: Not on file  Transportation Needs: Not on file  Physical Activity: Not on file  Stress: Not on file  Social Connections: Not on file   BP 112/63   Pulse 74   Temp 97.8 F (36.6 C) (Oral)   Resp 16   Ht 5\' 2"  (1.575 m)   Wt 66.9 kg   SpO2 98%   BMI 26.98 kg/m  Results for orders placed or performed during the hospital encounter of 08/05/22 (from the past 24 hour(s))  CBC     Status: None   Collection Time: 08/05/22  6:30 AM  Result Value Ref Range   WBC 4.8 4.0 - 10.5 K/uL   RBC 4.82 3.87 - 5.11 MIL/uL   Hemoglobin 14.2 12.0 - 15.0 g/dL   HCT 42.0 36.0 - 46.0 %   MCV 87.1 80.0 - 100.0 fL   MCH 29.5 26.0 - 34.0 pg   MCHC 33.8 30.0 - 36.0 g/dL   RDW 12.2 11.5 - 15.5 %   Platelets 251 150 - 400 K/uL   nRBC 0.0 0.0 - 0.2 %   Abdomen is soft and non tender  Pelvic EGBUS  Ultrasound in office last week 15 mm sized tissue consistent with RPOC  IMPRESSION: Retained Products of conception  PLAN: D and E  RH positive Risks reviewed Consent signed

## 2022-08-05 NOTE — Discharge Instructions (Addendum)
  Post Anesthesia Home Care Instructions  Activity: Get plenty of rest for the remainder of the day. A responsible individual must stay with you for 24 hours following the procedure.  For the next 24 hours, DO NOT: -Drive a car -Paediatric nurse -Drink alcoholic beverages -Take any medication unless instructed by your physician -Make any legal decisions or sign important papers.  Meals: Start with liquid foods such as gelatin or soup. Progress to regular foods as tolerated. Avoid greasy, spicy, heavy foods. If nausea and/or vomiting occur, drink only clear liquids until the nausea and/or vomiting subsides. Call your physician if vomiting continues.  Special Instructions/Symptoms: Your throat may feel dry or sore from the anesthesia or the breathing tube placed in your throat during surgery. If this causes discomfort, gargle with warm salt water. The discomfort should disappear within 24 hours.    D & E Home care Instructions:   Personal hygiene:  Used sanitary napkins for vaginal drainage not tampons. Shower or tub bathe the day after your procedure. No douching until bleeding stops. Always wipe from front to back after  Elimination.  Activity: Do not drive or operate any equipment today. The effects of the anesthesia are still present and drowsiness may result. Rest today, not necessarily flat bed rest, just take it easy. You may resume your normal activity in one to 2 days.  Sexual activity: No intercourse for one week or as indicated by your physician  Diet: Eat a light diet as desired this evening. You may resume a regular diet tomorrow.  Return to work: One to 2 days.  General Expectations of your surgery: Vaginal bleeding should be no heavier than a normal period. Spotting may continue up to 10 days. Mild cramps may continue for a couple of days. You may have a regular period in 2-6 weeks.  Unexpected observations call your doctor if these occur: persistent or heavy bleeding.  Severe abdominal cramping or pain. Elevation of temperature greater than 100F.  Call for an appointment in one week.  May take Tylenol beginning at 30 Noon today as needed for cramping/discomfort.

## 2022-08-06 ENCOUNTER — Encounter (HOSPITAL_BASED_OUTPATIENT_CLINIC_OR_DEPARTMENT_OTHER): Payer: Self-pay | Admitting: Obstetrics and Gynecology

## 2022-08-06 LAB — SURGICAL PATHOLOGY

## 2022-08-13 ENCOUNTER — Encounter (HOSPITAL_BASED_OUTPATIENT_CLINIC_OR_DEPARTMENT_OTHER): Payer: Self-pay

## 2022-08-13 ENCOUNTER — Ambulatory Visit (INDEPENDENT_AMBULATORY_CARE_PROVIDER_SITE_OTHER): Payer: 59 | Admitting: Family Medicine

## 2022-08-13 ENCOUNTER — Encounter: Payer: Self-pay | Admitting: Family Medicine

## 2022-08-13 ENCOUNTER — Ambulatory Visit (HOSPITAL_BASED_OUTPATIENT_CLINIC_OR_DEPARTMENT_OTHER)
Admission: RE | Admit: 2022-08-13 | Discharge: 2022-08-13 | Disposition: A | Discharge status: Home / Self Care | Payer: 59 | Source: Ambulatory Visit | Attending: Family Medicine | Admitting: Family Medicine

## 2022-08-13 VITALS — BP 101/66 | HR 70 | Temp 97.5°F | Ht 62.0 in | Wt 149.2 lb

## 2022-08-13 DIAGNOSIS — R109 Unspecified abdominal pain: Secondary | ICD-10-CM | POA: Diagnosis not present

## 2022-08-13 DIAGNOSIS — E785 Hyperlipidemia, unspecified: Secondary | ICD-10-CM

## 2022-08-13 DIAGNOSIS — R1084 Generalized abdominal pain: Secondary | ICD-10-CM

## 2022-08-13 DIAGNOSIS — R1032 Left lower quadrant pain: Secondary | ICD-10-CM | POA: Diagnosis not present

## 2022-08-13 LAB — COMPREHENSIVE METABOLIC PANEL
ALT: 11 U/L (ref 0–35)
AST: 14 U/L (ref 0–37)
Albumin: 4.5 g/dL (ref 3.5–5.2)
Alkaline Phosphatase: 55 U/L (ref 39–117)
BUN: 9 mg/dL (ref 6–23)
CO2: 26 mEq/L (ref 19–32)
Calcium: 8.7 mg/dL (ref 8.4–10.5)
Chloride: 105 mEq/L (ref 96–112)
Creatinine, Ser: 0.74 mg/dL (ref 0.40–1.20)
GFR: 102.57 mL/min (ref >60.00)
Glucose, Bld: 94 mg/dL (ref 70–99)
Potassium: 4.2 mEq/L (ref 3.5–5.1)
Sodium: 139 mEq/L (ref 135–145)
Total Bilirubin: 0.5 mg/dL (ref 0.2–1.2)
Total Protein: 6.8 g/dL (ref 6.0–8.3)

## 2022-08-13 LAB — CBC
HCT: 41.3 % (ref 36.0–46.0)
Hemoglobin: 14.2 g/dL (ref 12.0–15.0)
MCHC: 34.4 g/dL (ref 30.0–36.0)
MCV: 87.4 fl (ref 78.0–100.0)
Platelets: 250 10*3/uL (ref 150.0–400.0)
RBC: 4.73 Mil/uL (ref 3.87–5.11)
RDW: 13.5 % (ref 11.5–15.5)
WBC: 5 10*3/uL (ref 4.0–10.5)

## 2022-08-13 LAB — LIPID PANEL
Cholesterol: 188 mg/dL (ref 0–200)
HDL: 62.1 mg/dL (ref >39.00)
LDL Cholesterol: 112 mg/dL — ABNORMAL HIGH (ref 0–99)
NonHDL: 126.02
Total CHOL/HDL Ratio: 3
Triglycerides: 71 mg/dL (ref 0.0–149.0)
VLDL: 14.2 mg/dL (ref 0.0–40.0)

## 2022-08-13 LAB — IBC + FERRITIN
Ferritin: 16.9 ng/mL (ref 10.0–291.0)
Iron: 78 ug/dL (ref 42–145)
Saturation Ratios: 20.8 % (ref 20.0–50.0)
TIBC: 375.2 ug/dL (ref 250.0–450.0)
Transferrin: 268 mg/dL (ref 212.0–360.0)

## 2022-08-13 LAB — TSH: TSH: 2.34 u[IU]/mL (ref 0.35–5.50)

## 2022-08-13 LAB — LIPASE: Lipase: 13 U/L (ref 11.0–59.0)

## 2022-08-13 MED ORDER — IOHEXOL 300 MG/ML  SOLN
100.0000 mL | Freq: Once | INTRAMUSCULAR | Status: AC | PRN
  Administered 2022-08-13: 75 mL via INTRAVENOUS

## 2022-08-13 NOTE — Patient Instructions (Signed)
It was very nice to see you today!  We will check blood work today.  We will also check a CT scan.  Please let us know if your symptoms change.  Take care, Dr Jerline Pain  PLEASE NOTE:  If you had any lab tests, please let us know if you have not heard back within a few days. You may see your results on mychart before we have a chance to review them but we will give you a call once they are reviewed by [place].   If we ordered any referrals today, please let us know if you have not heard from their office within the next week.   If you had any urgent prescriptions sent in today, please check with the pharmacy within an hour of our visit to make sure the prescription was transmitted appropriately.   Please try these tips to maintain a healthy lifestyle:  Eat at least 3 REAL meals and 1-2 snacks per day.  Aim for no more than 5 hours between eating.  If you eat breakfast, please do so within one hour of getting up.   Each meal should contain half fruits/vegetables, one quarter protein, and one quarter carbs (no bigger than a computer mouse)  Cut down on sweet beverages. This includes juice, soda, and sweet tea.   Drink at least 1 glass of water with each meal and aim for at least 8 glasses per day  Exercise at least 150 minutes every week.

## 2022-08-13 NOTE — Progress Notes (Signed)
   Rachael [name] is a 37 y.o. female who presents today for an office visit.  Assessment/Plan:  New/Acute Problems: Abdominal Pain Reassuring vitals today and no red flags.  She did undergo D&E a week ago however does not have any other clinical signs consistent with endometritis and additionally her symptoms started several weeks prior to her D&E.  It is possible her abdominal pain could be related to her recent pregnancy including round ligament pain though would expect this to be improving since her D&E.  Given that symptoms do seem to worsen with Valsalva maneuvers hernia is also on the differential though no appreciable hernias on exam today.  No peritoneal signs on exam today.  We will check labs.  Given the symptoms without any other obvious explanation we will check CT scan to further evaluate.  We discussed reasons return to care and seek emergent care.  Chronic Problems Addressed Today: Dyslipidemia Check lipids.    Subjective:  HPI:  See A/P for status of chronic conditions.  Patient is here with abdominal pain.    She has been having more left lower quadrant pain.Worse with bowel movement and urination.  Worse with Valsalva maneuvers.  Normal bowel movements. This has been going on for about 6 weeks. No nausea or vomiting. She has had intermittent issues with abdominal over the last several years. She has seen GI in the past and had workup with them including ultrasound and endoscopy which showed sequela of GERD but no other acute findings.  No fevers or chills.  No melena or hematochezia. Unfortunately she suffered a miscarriage several weeks ago and underwent D&E a week ago.  She has done well postoperatively.  Her current symptoms started several weeks before her D&E. Her LMP was 04/03/2022.        Objective:  Physical Exam: BP 101/66   Pulse 70   Temp (!) 97.5 F (36.4 C) (Temporal)   Ht 5\' 2"  (1.575 m)   Wt 149 lb 3.2 oz (67.7 kg)   LMP 04/03/2022   SpO2 100%   BMI  27.29 kg/m   Gen: No acute distress, resting comfortably CV: Regular rate and rhythm with no murmurs appreciated Pulm: Normal work of breathing, clear to auscultation bilaterally with no crackles, wheezes, or rhonchi GI: Soft, nondistended.  Tenderness palpation in the left lower quadrant.  Mild guarding.  No rebound tenderness.  Bowel sounds present. Neuro: Grossly normal, moves all extremities Psych: Normal affect and thought content  Time Spent: 30 minutes of total time was spent on the date of the encounter performing the following actions: chart review prior to seeing the patient including recent visits with OB/GYN and operative note, obtaining history, performing a medically necessary exam, counseling on the treatment plan, placing orders, and documenting in our EHR.        Algis Greenhouse. Jerline Pain, MD 08/13/2022 8:10 AM

## 2022-08-14 NOTE — Progress Notes (Signed)
Please inform patient of the following:  Her ultrasound did not show any acute findings.  No signs of inflammation or appendicitis or any other obvious explanation for her pain.  It is possible that she could be having round ligament pain related to her recent pregnancy.  She should let us know if her pain is not improving over the next 1 to 2 weeks.

## 2022-08-14 NOTE — Progress Notes (Signed)
Please inform patient of the following:  Her bad cholesterol is a little elevated but better than last year.  All of her other labs are stable.  Do not need to make any changes to her treatment plan at this time.  Please see her other result note for the CT scan regarding her abdominal pain.  We can repeat her blood work in a year or so.  Algis Greenhouse. Jerline Pain, MD 08/14/2022 3:23 PM

## 2022-10-24 ENCOUNTER — Other Ambulatory Visit: Payer: Self-pay

## 2022-10-24 ENCOUNTER — Other Ambulatory Visit (HOSPITAL_COMMUNITY): Payer: Self-pay

## 2022-12-05 ENCOUNTER — Other Ambulatory Visit (HOSPITAL_COMMUNITY): Payer: Self-pay

## 2022-12-05 MED ORDER — BUPROPION HCL ER (XL) 150 MG PO TB24
150.0000 mg | ORAL_TABLET | Freq: Every day | ORAL | 1 refills | Status: DC
  Filled 2022-12-05: qty 30, 30d supply, fill #0

## 2023-01-01 ENCOUNTER — Other Ambulatory Visit (HOSPITAL_COMMUNITY): Payer: Self-pay

## 2023-01-01 DIAGNOSIS — R635 Abnormal weight gain: Secondary | ICD-10-CM | POA: Diagnosis not present

## 2023-01-01 DIAGNOSIS — F418 Other specified anxiety disorders: Secondary | ICD-10-CM | POA: Diagnosis not present

## 2023-01-01 DIAGNOSIS — Z01419 Encounter for gynecological examination (general) (routine) without abnormal findings: Secondary | ICD-10-CM | POA: Diagnosis not present

## 2023-01-01 DIAGNOSIS — Z6826 Body mass index (BMI) 26.0-26.9, adult: Secondary | ICD-10-CM | POA: Diagnosis not present

## 2023-01-01 MED ORDER — BUPROPION HCL ER (XL) 300 MG PO TB24
300.0000 mg | ORAL_TABLET | Freq: Every day | ORAL | 3 refills | Status: DC
  Filled 2023-01-01: qty 90, 90d supply, fill #0

## 2023-01-07 ENCOUNTER — Other Ambulatory Visit (HOSPITAL_COMMUNITY): Payer: Self-pay

## 2023-03-06 ENCOUNTER — Other Ambulatory Visit (HOSPITAL_COMMUNITY): Payer: Self-pay

## 2023-03-06 DIAGNOSIS — N898 Other specified noninflammatory disorders of vagina: Secondary | ICD-10-CM | POA: Diagnosis not present

## 2023-03-06 DIAGNOSIS — Z3201 Encounter for pregnancy test, result positive: Secondary | ICD-10-CM | POA: Diagnosis not present

## 2023-03-06 DIAGNOSIS — F419 Anxiety disorder, unspecified: Secondary | ICD-10-CM | POA: Diagnosis not present

## 2023-03-06 MED ORDER — BUPROPION HCL ER (XL) 150 MG PO TB24
150.0000 mg | ORAL_TABLET | Freq: Every day | ORAL | 3 refills | Status: DC
  Filled 2023-03-06: qty 90, 90d supply, fill #0

## 2023-03-10 DIAGNOSIS — Z3201 Encounter for pregnancy test, result positive: Secondary | ICD-10-CM | POA: Diagnosis not present

## 2023-03-11 ENCOUNTER — Other Ambulatory Visit (HOSPITAL_COMMUNITY): Payer: Self-pay

## 2023-03-11 MED ORDER — PROGESTERONE 200 MG PO CAPS
200.0000 mg | ORAL_CAPSULE | Freq: Every day | ORAL | 0 refills | Status: DC
  Filled 2023-03-11: qty 15, 15d supply, fill #0

## 2023-03-11 MED ORDER — ENDOMETRIN 100 MG VA INST
VAGINAL_INSERT | VAGINAL | 0 refills | Status: DC
  Filled 2023-03-11: qty 30, 15d supply, fill #0

## 2023-03-25 ENCOUNTER — Other Ambulatory Visit (HOSPITAL_COMMUNITY): Payer: Self-pay

## 2023-03-25 DIAGNOSIS — N911 Secondary amenorrhea: Secondary | ICD-10-CM | POA: Diagnosis not present

## 2023-03-25 MED ORDER — PROGESTERONE 200 MG PO CAPS
200.0000 mg | ORAL_CAPSULE | Freq: Every day | ORAL | 0 refills | Status: DC
  Filled 2023-03-25: qty 30, 30d supply, fill #0

## 2023-04-20 DIAGNOSIS — Z3481 Encounter for supervision of other normal pregnancy, first trimester: Secondary | ICD-10-CM | POA: Diagnosis not present

## 2023-04-20 DIAGNOSIS — O09511 Supervision of elderly primigravida, first trimester: Secondary | ICD-10-CM | POA: Diagnosis not present

## 2023-04-20 DIAGNOSIS — Z3401 Encounter for supervision of normal first pregnancy, first trimester: Secondary | ICD-10-CM | POA: Diagnosis not present

## 2023-04-20 DIAGNOSIS — Z3A10 10 weeks gestation of pregnancy: Secondary | ICD-10-CM | POA: Diagnosis not present

## 2023-04-20 DIAGNOSIS — Z3685 Encounter for antenatal screening for Streptococcus B: Secondary | ICD-10-CM | POA: Diagnosis not present

## 2023-04-20 LAB — OB RESULTS CONSOLE RPR: RPR: NONREACTIVE

## 2023-04-20 LAB — OB RESULTS CONSOLE HEPATITIS B SURFACE ANTIGEN: Hepatitis B Surface Ag: NEGATIVE

## 2023-04-20 LAB — OB RESULTS CONSOLE RUBELLA ANTIBODY, IGM: Rubella: IMMUNE

## 2023-04-20 LAB — OB RESULTS CONSOLE HIV ANTIBODY (ROUTINE TESTING): HIV: NONREACTIVE

## 2023-04-20 LAB — HEPATITIS C ANTIBODY: HCV Ab: NEGATIVE

## 2023-04-20 LAB — OB RESULTS CONSOLE ANTIBODY SCREEN: Antibody Screen: NEGATIVE

## 2023-04-24 DIAGNOSIS — Z113 Encounter for screening for infections with a predominantly sexual mode of transmission: Secondary | ICD-10-CM | POA: Diagnosis not present

## 2023-04-24 DIAGNOSIS — Z3A11 11 weeks gestation of pregnancy: Secondary | ICD-10-CM | POA: Diagnosis not present

## 2023-04-24 DIAGNOSIS — Z3400 Encounter for supervision of normal first pregnancy, unspecified trimester: Secondary | ICD-10-CM | POA: Diagnosis not present

## 2023-04-24 LAB — OB RESULTS CONSOLE GC/CHLAMYDIA
Chlamydia: NEGATIVE
Neisseria Gonorrhea: NEGATIVE

## 2023-05-21 DIAGNOSIS — Z361 Encounter for antenatal screening for raised alphafetoprotein level: Secondary | ICD-10-CM | POA: Diagnosis not present

## 2023-06-01 DIAGNOSIS — Z3A16 16 weeks gestation of pregnancy: Secondary | ICD-10-CM | POA: Diagnosis not present

## 2023-06-01 DIAGNOSIS — Z3402 Encounter for supervision of normal first pregnancy, second trimester: Secondary | ICD-10-CM | POA: Diagnosis not present

## 2023-06-01 DIAGNOSIS — N760 Acute vaginitis: Secondary | ICD-10-CM | POA: Diagnosis not present

## 2023-06-17 ENCOUNTER — Encounter (HOSPITAL_COMMUNITY): Payer: Self-pay

## 2023-06-17 ENCOUNTER — Inpatient Hospital Stay (HOSPITAL_COMMUNITY)
Admission: AD | Admit: 2023-06-17 | Discharge: 2023-06-17 | Disposition: A | Discharge status: Home / Self Care | Payer: No Typology Code available for payment source | Attending: Obstetrics and Gynecology | Admitting: Obstetrics and Gynecology

## 2023-06-17 DIAGNOSIS — O9A212 Injury, poisoning and certain other consequences of external causes complicating pregnancy, second trimester: Secondary | ICD-10-CM

## 2023-06-17 DIAGNOSIS — Z3A18 18 weeks gestation of pregnancy: Secondary | ICD-10-CM

## 2023-06-17 DIAGNOSIS — O26892 Other specified pregnancy related conditions, second trimester: Secondary | ICD-10-CM

## 2023-06-17 DIAGNOSIS — R109 Unspecified abdominal pain: Secondary | ICD-10-CM | POA: Diagnosis not present

## 2023-06-17 DIAGNOSIS — O26899 Other specified pregnancy related conditions, unspecified trimester: Secondary | ICD-10-CM

## 2023-06-17 LAB — URINALYSIS, ROUTINE W REFLEX MICROSCOPIC
Bilirubin Urine: NEGATIVE
Glucose, UA: NEGATIVE mg/dL
Hgb urine dipstick: NEGATIVE
Ketones, ur: NEGATIVE mg/dL
Leukocytes,Ua: NEGATIVE
Nitrite: NEGATIVE
Protein, ur: NEGATIVE mg/dL
Specific Gravity, Urine: 1.012 (ref 1.005–1.030)
pH: 6 (ref 5.0–8.0)

## 2023-06-17 NOTE — MAU Provider Note (Signed)
History     CSN: [id number]  Arrival date and time: 06/17/23 1340   Event Date/Time   First Provider Initiated Contact with Patient 06/17/23 1444      Chief Complaint  Patient presents with   Abdominal Pain   Flank Pain   HPI  Rachael [name] is a 37 y.o. 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 During a case, a patient almost came off the table and she "caught him" with with her RT hip/flank @ 1248. She states she completed the case and took him to PACU. While in PACU, she started feeling pain in the RT hip/flank area. She was told by her manager that she needed to be seen in MAU, since it happened at work and "in case something happened." She called her OB office and was also told to come in to be seen. She receives [facility]; next appt is 06/18/2023.   OB History     Gravida  2   Para      Term      Preterm      AB      Living         SAB      IAB      Ectopic      Multiple      Live Births              Past Medical History:  Diagnosis Date   ADHD    GERD (gastroesophageal reflux disease)    Migraines     Past Surgical History:  Procedure Laterality Date   BREAST BIOPSY  2012   COLONOSCOPY  2011   DILATION AND EVACUATION N/A 08/05/2022   Procedure: DILATATION AND EVACUATION;  Surgeon: Marcelle Overlie, MD;  Location: [facility] [place];  Service: Gynecology;  Laterality: N/A;   ESOPHAGOGASTRODUODENOSCOPY     2 yrs ago  08/01/2022    Family History  Problem Relation Age of Onset   Migraines Mother    Colon cancer Father    Prostate cancer Maternal Grandfather        dx in 51s   Cancer Maternal Grandfather        colangiocarcinoma   Prostate cancer Maternal Uncle        dx in 55s-50s   Breast cancer Paternal Aunt        dx in 48s   Ovarian cancer Paternal Aunt        dx in 27s   Uterine cancer Paternal Aunt        dx in  26s   Colon cancer Paternal Aunt        dx in 60s    Social History   Tobacco Use   Smoking status: Never   Smokeless tobacco: Never  Vaping Use   Vaping status: Never Used  Substance Use Topics   Alcohol use: Yes   Drug use: Never    Allergies: No Known Allergies  Medications Prior to Admission  Medication Sig Dispense Refill Last Dose   omeprazole (PRILOSEC) 10 MG capsule Take 10 mg by mouth daily.   06/17/2023   Prenatal Vit-Fe Fumarate-FA (MULTIVITAMIN-PRENATAL) 27-0.8 MG TABS tablet Take 1 tablet by mouth daily at 12 noon.   06/17/2023   buPROPion (WELLBUTRIN XL) 150 MG 24 hr tablet Take 1 tablet (150 mg total) by mouth daily. 30 tablet 1  buPROPion (WELLBUTRIN XL) 300 MG 24 hr tablet Take 1 tablet (300 mg total) by mouth daily. 90 tablet 3    etonogestrel-ethinyl estradiol (NUVARING) 0.12-0.015 MG/24HR vaginal ring INSERT 1 VAGINAL RING EVERY MONTH AS DIRECTED FOR 21 DAYS (Patient not taking: Reported on 08/05/2022) 3 each 3    progesterone (ENDOMETRIN) 100 MG vaginal insert Insert 2 every day vaginally 30 each 0    progesterone (PROMETRIUM) 200 MG capsule Take 1 capsule (200 mg total) by mouth daily. 30 capsule 0     Review of Systems  Constitutional: Negative.   HENT: Negative.    Eyes: Negative.   Respiratory: Negative.    Cardiovascular: Negative.   Gastrointestinal: Negative.   Endocrine: Negative.   Genitourinary:  Positive for flank pain (RT).  Musculoskeletal:  Positive for myalgias (RT hip pain).  Skin: Negative.   Allergic/Immunologic: Negative.   Neurological: Negative.   Hematological: Negative.   Psychiatric/Behavioral: Negative.     Physical Exam   Blood pressure (!) 100/57, pulse 74, temperature 98 F (36.7 C), temperature source Oral, resp. rate 17, height 5\' 2"  (1.575 m), weight 66.9 kg, last menstrual period 04/03/2022, SpO2 100%, unknown if currently breastfeeding.  Physical Exam Vitals and nursing note reviewed.  Constitutional:       Appearance: Normal appearance. She is normal weight.  Cardiovascular:     Rate and Rhythm: Normal rate.  Pulmonary:     Effort: Pulmonary effort is normal.  Abdominal:     General: Abdomen is flat.     Palpations: Abdomen is soft.  Genitourinary:    General: Normal vulva.  Musculoskeletal:        General: Normal range of motion.  Skin:    General: Skin is warm and dry.  Neurological:     Mental Status: She is alert.     MAU Course  Procedures Patient informed that the ultrasound is considered a limited OB ultrasound and is not intended to be a complete ultrasound exam.  Patient also informed that the ultrasound is not being completed with the intent of assessing for fetal or placental anomalies or any pelvic abnormalities.  Explained that the purpose of today's ultrasound is to assess for viability.  Baby was found to be viable and very active. Anterior placenta. Patient acknowledges the purpose of the exam and the limitations of the study.  MDM CCUA Informal BS U/S Offered Flexeril and Tylenol -- patient declined  Results for orders placed or performed during the hospital encounter of 06/17/23 (from the past 24 hour(s))  Urinalysis, Routine w reflex microscopic -Urine, Clean Catch     Status: None   Collection Time: 06/17/23  2:30 PM  Result Value Ref Range   Color, Urine YELLOW YELLOW   APPearance CLEAR CLEAR   Specific Gravity, Urine 1.012 1.005 - 1.030   pH 6.0 5.0 - 8.0   Glucose, UA NEGATIVE NEGATIVE mg/dL   Hgb urine dipstick NEGATIVE NEGATIVE   Bilirubin Urine NEGATIVE NEGATIVE   Ketones, ur NEGATIVE NEGATIVE mg/dL   Protein, ur NEGATIVE NEGATIVE mg/dL   Nitrite NEGATIVE NEGATIVE   Leukocytes,Ua NEGATIVE NEGATIVE    Assessment and Plan  1. Flank pain in pregnant patient - Discharge patient  2. Traumatic injury during pregnancy in second trimester - Discharge patient  3. [redacted] weeks gestation of pregnancy   - Keep scheduled appt with [facility] on 06/18/2023 -  Advised to take Tylenol 1000 mg po every 8 hrs prn pain and apply warm compress to affected area - Patient  verbalized an understanding of the plan of care and agrees.    Raelyn Mora, CNM 06/17/2023, 2:44 PM

## 2023-06-17 NOTE — Discharge Instructions (Signed)
You can apply warm compress to the affected area for 15 minutes at a time. You can take Tylenol 1000 mg by mouth every 8 hours as needed for pain.

## 2023-06-17 NOTE — MAU Note (Signed)
Rachael [name] is a 37 y.o. at [redacted]w[redacted]d here in MAU reporting: was in the OR [employer] social research officer). Caught pt as coming off table, rt flank/hip.  Now having pain in rt abd  and flank.  Finished case, call dr, then came for eval.  No bleeding. Onset of complaint: 1248 Pain score: 4 Vitals:   06/17/23 1424  BP: (!) 100/57  Pulse: 74  Resp: 17  Temp: 98 F (36.7 C)  SpO2: 100%     FHT:153 Lab orders placed from triage:    Pt sent to restroom while in triage, had been holding it in case we needed a spec.

## 2023-06-18 DIAGNOSIS — Z3400 Encounter for supervision of normal first pregnancy, unspecified trimester: Secondary | ICD-10-CM | POA: Diagnosis not present

## 2023-06-18 DIAGNOSIS — Z363 Encounter for antenatal screening for malformations: Secondary | ICD-10-CM | POA: Diagnosis not present

## 2023-06-18 DIAGNOSIS — Z3A19 19 weeks gestation of pregnancy: Secondary | ICD-10-CM | POA: Diagnosis not present

## 2023-06-18 LAB — CULTURE, OB URINE: Culture: NO GROWTH

## 2023-08-12 DIAGNOSIS — Z3482 Encounter for supervision of other normal pregnancy, second trimester: Secondary | ICD-10-CM | POA: Diagnosis not present

## 2023-08-12 DIAGNOSIS — Z23 Encounter for immunization: Secondary | ICD-10-CM | POA: Diagnosis not present

## 2023-08-12 DIAGNOSIS — Z3480 Encounter for supervision of other normal pregnancy, unspecified trimester: Secondary | ICD-10-CM | POA: Diagnosis not present

## 2023-08-12 DIAGNOSIS — Z3A26 26 weeks gestation of pregnancy: Secondary | ICD-10-CM | POA: Diagnosis not present

## 2023-08-25 DIAGNOSIS — F4321 Adjustment disorder with depressed mood: Secondary | ICD-10-CM | POA: Diagnosis not present

## 2023-09-15 DIAGNOSIS — Z3A31 31 weeks gestation of pregnancy: Secondary | ICD-10-CM | POA: Diagnosis not present

## 2023-09-15 DIAGNOSIS — F4323 Adjustment disorder with mixed anxiety and depressed mood: Secondary | ICD-10-CM | POA: Diagnosis not present

## 2023-09-15 DIAGNOSIS — Z3400 Encounter for supervision of normal first pregnancy, unspecified trimester: Secondary | ICD-10-CM | POA: Diagnosis not present

## 2023-09-15 DIAGNOSIS — N760 Acute vaginitis: Secondary | ICD-10-CM | POA: Diagnosis not present

## 2023-09-28 NOTE — Progress Notes (Unsigned)
 [name] [address] [place] [address] Phone: [phone] Subjective:   Rachael [name], am serving as a scribe for Dr. Antoine Primas.  I'm seeing this patient by the request  of:  Ardith Dark, MD  CC: Low back pain follow-up  [id number]  Rachael [name] is a 38 y.o. female coming in with complaint of lumbar spine pain. Last seen in 2022 for OMT. Patient states that her back pain is worse as she has to stand 4-5 hours at work. Pain in calves at night. Pain radiates down into the R quad. Sometimes when walking she will not feel her leg.       Past Medical History:  Diagnosis Date   ADHD    GERD (gastroesophageal reflux disease)    Migraines    Past Surgical History:  Procedure Laterality Date   BREAST BIOPSY  2012   COLONOSCOPY  2011   DILATION AND EVACUATION N/A 08/05/2022   Procedure: DILATATION AND EVACUATION;  Surgeon: Marcelle Overlie, MD;  Location: [facility] [place];  Service: Gynecology;  Laterality: N/A;   ESOPHAGOGASTRODUODENOSCOPY     2 yrs ago  08/01/2022   Social History   Socioeconomic History   Marital status: Married    Spouse name: Not on file   Number of children: Not on file   Years of education: Not on file   Highest education level: Not on file  Occupational History   Not on file  Tobacco Use   Smoking status: Never   Smokeless tobacco: Never  Vaping Use   Vaping status: Never Used  Substance and Sexual Activity   Alcohol use: Yes   Drug use: Never   Sexual activity: Not on file  Other Topics Concern   Not on file  Social History Narrative   Not on file   Social Drivers of Health   Financial Resource Strain: Not on file  Food Insecurity: Not on file  Transportation Needs: Not on file  Physical Activity: Not on file  Stress: Not on file  Social Connections: Not on file   No Known Allergies Family History  Problem Relation Age of Onset   Migraines Mother    Colon cancer  Father    Prostate cancer Maternal Grandfather        dx in 66s   Cancer Maternal Grandfather        colangiocarcinoma   Prostate cancer Maternal Uncle        dx in 46s-50s   Breast cancer Paternal Aunt        dx in 75s   Ovarian cancer Paternal Aunt        dx in 69s   Uterine cancer Paternal Aunt        dx in 71s   Colon cancer Paternal Aunt        dx in 53s         Current Outpatient Medications (Other):    omeprazole (PRILOSEC) 10 MG capsule, Take 10 mg by mouth daily.   Prenatal Vit-Fe Fumarate-FA (MULTIVITAMIN-PRENATAL) 27-0.8 MG TABS tablet, Take 1 tablet by mouth daily at 12 noon.   buPROPion (WELLBUTRIN XL) 150 MG 24 hr tablet, Take 1 tablet (150 mg total) by mouth daily.   buPROPion (WELLBUTRIN XL) 300 MG 24 hr tablet, Take 1 tablet (300 mg total) by mouth daily.   Reviewed prior external information including notes and imaging from  primary care provider As well as notes that were available  from care everywhere and other healthcare systems.  Past medical history, social, surgical and family history all reviewed in electronic medical record.  No pertanent information unless stated regarding to the chief complaint.   Review of Systems:  No headache, visual changes, nausea, vomiting, diarrhea, constipation, dizziness, abdominal pain, skin rash, fevers, chills, night sweats, weight loss, swollen lymph nodes, body aches, joint swelling, chest pain, shortness of breath, mood changes. POSITIVE muscle aches  Objective  Blood pressure 110/62, pulse 85, height 5\' 2"  (1.575 m), weight 172 lb (78 kg), last menstrual period 04/03/2022, SpO2 99%, unknown if currently breastfeeding.   General: No apparent distress alert and oriented x3 mood and affect normal, dressed appropriately.  Patient is gravid HEENT: Pupils equal, extraocular movements intact  Respiratory: Patient's speak in full sentences and does not appear short of breath  Cardiovascular: No lower extremity edema, non  tender, no erythema  Low back exam shows loss of lordosis tightness with FABER    Osteopathic findings C3 flexed rotated and side bent right C4 flexed rotated and side bent left T4 extended rotated and side bent right inhaled third rib L1 flexed rotated and side bent right L4 flexed rotated and side bent left Sacrum right on right     Impression and Recommendations:  Cervicogenic headache Discussed HEP  Discussed which activities avoiding certain motions. Discussed lifting mechanism progesterone could be potentially contributing as well.  Patient did respond well to the osteopathic manipulation and hopeful that this will make a difference.  I do feel that when patient's pregnancy allows for the fetus to move into the pelvic area we will get some relief of some of the back pain.  Would like to see her noted in 3 to 4 weeks for further evaluation     Decision today to treat with OMT was based on Physical Exam  After verbal consent patient was treated with HVLA, ME, FPR techniques in cervical, thoracic, rib, lumbar and sacral areas, all areas are chronic   Patient tolerated the procedure well with improvement in symptoms  Patient given exercises, stretches and lifestyle modifications  See medications in patient instructions if given  Patient will follow up in 4-8 weeks    The above documentation has been reviewed and is accurate and complete [name], DO

## 2023-09-30 ENCOUNTER — Ambulatory Visit (INDEPENDENT_AMBULATORY_CARE_PROVIDER_SITE_OTHER): Admitting: Family Medicine

## 2023-09-30 ENCOUNTER — Encounter: Payer: Self-pay | Admitting: Family Medicine

## 2023-09-30 VITALS — BP 110/62 | HR 85 | Ht 62.0 in | Wt 172.0 lb

## 2023-09-30 DIAGNOSIS — M9903 Segmental and somatic dysfunction of lumbar region: Secondary | ICD-10-CM

## 2023-09-30 DIAGNOSIS — G8929 Other chronic pain: Secondary | ICD-10-CM | POA: Diagnosis not present

## 2023-09-30 DIAGNOSIS — M9908 Segmental and somatic dysfunction of rib cage: Secondary | ICD-10-CM

## 2023-09-30 DIAGNOSIS — M9904 Segmental and somatic dysfunction of sacral region: Secondary | ICD-10-CM | POA: Diagnosis not present

## 2023-09-30 DIAGNOSIS — M25551 Pain in right hip: Secondary | ICD-10-CM | POA: Diagnosis not present

## 2023-09-30 DIAGNOSIS — G4486 Cervicogenic headache: Secondary | ICD-10-CM

## 2023-09-30 DIAGNOSIS — M9901 Segmental and somatic dysfunction of cervical region: Secondary | ICD-10-CM

## 2023-09-30 DIAGNOSIS — M9902 Segmental and somatic dysfunction of thoracic region: Secondary | ICD-10-CM | POA: Diagnosis not present

## 2023-09-30 NOTE — Patient Instructions (Signed)
 SI joint exercises See me in 3-4 weeks

## 2023-09-30 NOTE — Assessment & Plan Note (Signed)
 Exacerbated in the sacroiliac joint secondary to the pregnancy.  We discussed the progesterone causing more laxity of certain things and having to do more of the strengthening aspect.

## 2023-09-30 NOTE — Assessment & Plan Note (Signed)
 Discussed HEP  Discussed which activities avoiding certain motions. Discussed lifting mechanism progesterone could be potentially contributing as well.  Patient did respond well to the osteopathic manipulation and hopeful that this will make a difference.  I do feel that when patient's pregnancy allows for the fetus to move into the pelvic area we will get some relief of some of the back pain.  Would like to see her noted in 3 to 4 weeks for further evaluation

## 2023-10-12 ENCOUNTER — Other Ambulatory Visit (HOSPITAL_COMMUNITY): Payer: Self-pay

## 2023-10-12 DIAGNOSIS — Z3483 Encounter for supervision of other normal pregnancy, third trimester: Secondary | ICD-10-CM | POA: Diagnosis not present

## 2023-10-12 DIAGNOSIS — Z3482 Encounter for supervision of other normal pregnancy, second trimester: Secondary | ICD-10-CM | POA: Diagnosis not present

## 2023-10-12 MED ORDER — VALACYCLOVIR HCL 500 MG PO TABS
500.0000 mg | ORAL_TABLET | Freq: Two times a day (BID) | ORAL | 1 refills | Status: DC
  Filled 2023-10-12: qty 60, 30d supply, fill #0

## 2023-10-20 DIAGNOSIS — Z3685 Encounter for antenatal screening for Streptococcus B: Secondary | ICD-10-CM | POA: Diagnosis not present

## 2023-10-20 DIAGNOSIS — Z3403 Encounter for supervision of normal first pregnancy, third trimester: Secondary | ICD-10-CM | POA: Diagnosis not present

## 2023-10-20 DIAGNOSIS — Z361 Encounter for antenatal screening for raised alphafetoprotein level: Secondary | ICD-10-CM | POA: Diagnosis not present

## 2023-10-20 DIAGNOSIS — O321XX9 Maternal care for breech presentation, other fetus: Secondary | ICD-10-CM | POA: Diagnosis not present

## 2023-10-20 DIAGNOSIS — Z3A36 36 weeks gestation of pregnancy: Secondary | ICD-10-CM | POA: Diagnosis not present

## 2023-10-20 DIAGNOSIS — O26843 Uterine size-date discrepancy, third trimester: Secondary | ICD-10-CM | POA: Diagnosis not present

## 2023-10-21 NOTE — Patient Instructions (Signed)
 EUNICE WINECOFF  10/21/2023   Your procedure is scheduled on:  11/06/2023  Arrive at 0800 at Entrance C on [address] at [facility]  and [employer]. You are invited to use the FREE valet parking or use the Visitor's parking deck.  Pick up the phone at the desk and dial [phone].  Call this number if you have problems the morning of surgery: [phone]  Remember:   Do not eat food:(After Midnight) Desps de medianoche.  You may drink clear liquids until arrival at __0800___.  Clear liquids means a liquid you can see thru.  It can have color such as Cola or Kool aid.  Tea is OK and coffee as long as no milk or creamer of any kind.  Take these medicines the morning of surgery with A SIP OF WATER:  Wellbutrin and valtrex   Do not wear jewelry, make-up or nail polish.  Do not wear lotions, powders, or perfumes. Do not wear deodorant.  Do not shave 48 hours prior to surgery.  Do not bring valuables to the hospital.  [facility] is not   responsible for any belongings or valuables brought to the hospital.  Contacts, dentures or bridgework may not be worn into surgery.  Leave suitcase in the car. After surgery it may be brought to your room.  For patients admitted to the hospital, checkout time is 11:00 AM the day of              discharge.      Please read over the following fact sheets that you were given:     Preparing for Surgery

## 2023-10-22 ENCOUNTER — Encounter (HOSPITAL_COMMUNITY): Payer: Self-pay

## 2023-10-22 DIAGNOSIS — Z00110 Health examination for newborn under 8 days old: Secondary | ICD-10-CM | POA: Diagnosis not present

## 2023-10-25 DIAGNOSIS — Z00110 Health examination for newborn under 8 days old: Secondary | ICD-10-CM | POA: Diagnosis not present

## 2023-10-29 ENCOUNTER — Ambulatory Visit: Admitting: Family Medicine

## 2023-11-04 ENCOUNTER — Encounter (HOSPITAL_COMMUNITY)
Admission: RE | Admit: 2023-11-04 | Discharge: 2023-11-04 | Disposition: A | Discharge status: Home / Self Care | Source: Ambulatory Visit | Attending: Obstetrics and Gynecology | Admitting: Obstetrics and Gynecology

## 2023-11-04 DIAGNOSIS — Z3A39 39 weeks gestation of pregnancy: Secondary | ICD-10-CM | POA: Insufficient documentation

## 2023-11-04 DIAGNOSIS — O321XX0 Maternal care for breech presentation, not applicable or unspecified: Secondary | ICD-10-CM

## 2023-11-04 DIAGNOSIS — Z01812 Encounter for preprocedural laboratory examination: Secondary | ICD-10-CM | POA: Insufficient documentation

## 2023-11-04 LAB — CBC
HCT: 37.3 % (ref 36.0–46.0)
Hemoglobin: 12.7 g/dL (ref 12.0–15.0)
MCH: 31 pg (ref 26.0–34.0)
MCHC: 34 g/dL (ref 30.0–36.0)
MCV: 91 fL (ref 80.0–100.0)
Platelets: 142 10*3/uL — ABNORMAL LOW (ref 150–400)
RBC: 4.1 MIL/uL (ref 3.87–5.11)
RDW: 13.6 % (ref 11.5–15.5)
WBC: 7 10*3/uL (ref 4.0–10.5)
nRBC: 0 % (ref 0.0–0.2)

## 2023-11-04 LAB — TYPE AND SCREEN
ABO/RH(D): O POS
Antibody Screen: NEGATIVE

## 2023-11-04 LAB — RPR: RPR Ser Ql: NONREACTIVE

## 2023-11-05 NOTE — Anesthesia Preprocedure Evaluation (Addendum)
 Anesthesia Evaluation  Patient identified by MRN, date of birth, ID band Patient awake    Reviewed: Allergy & Precautions, NPO status , Patient's Chart, lab work & pertinent test results  History of Anesthesia Complications Negative for: history of anesthetic complications  Airway Mallampati: II  TM Distance: >3 FB Neck ROM: Full    Dental no notable dental hx.    Pulmonary neg pulmonary ROS   Pulmonary exam normal        Cardiovascular negative cardio ROS Normal cardiovascular exam     Neuro/Psych  Headaches    GI/Hepatic Neg liver ROS,GERD  Controlled and Medicated,,  Endo/Other  negative endocrine ROS    Renal/GU negative Renal ROS  negative genitourinary   Musculoskeletal negative musculoskeletal ROS (+)    Abdominal   Peds  Hematology negative hematology ROS (+)   Anesthesia Other Findings Day of surgery medications reviewed with patient.  Reproductive/Obstetrics (+) Pregnancy (breech presentation)                              Anesthesia Physical Anesthesia Plan  ASA: 2  Anesthesia Plan: Spinal   Post-op Pain Management: Ofirmev  IV (intra-op)*   Induction:   PONV Risk Score and Plan: 4 or greater and Treatment may vary due to age or medical condition, Ondansetron  and Dexamethasone   Airway Management Planned: Natural Airway  Additional Equipment: None  Intra-op Plan:   Post-operative Plan:   Informed Consent: I have reviewed the patients History and Physical, chart, labs and discussed the procedure including the risks, benefits and alternatives for the proposed anesthesia with the patient or authorized representative who has indicated his/her understanding and acceptance.       Plan Discussed with: CRNA  Anesthesia Plan Comments:         Anesthesia Quick Evaluation

## 2023-11-06 ENCOUNTER — Inpatient Hospital Stay (HOSPITAL_COMMUNITY): Payer: Self-pay | Admitting: Anesthesiology

## 2023-11-06 ENCOUNTER — Other Ambulatory Visit: Payer: Self-pay

## 2023-11-06 ENCOUNTER — Encounter (HOSPITAL_COMMUNITY): Payer: Self-pay | Admitting: Obstetrics and Gynecology

## 2023-11-06 ENCOUNTER — Inpatient Hospital Stay (HOSPITAL_COMMUNITY)
Admission: RE | Admit: 2023-11-06 | Discharge: 2023-11-08 | DRG: 788 | Disposition: A | Discharge status: Home / Self Care | Attending: Obstetrics and Gynecology | Admitting: Obstetrics and Gynecology

## 2023-11-06 ENCOUNTER — Encounter (HOSPITAL_COMMUNITY)
Admission: RE | Disposition: A | Discharge status: Home / Self Care | Payer: Self-pay | Source: Home / Self Care | Attending: Obstetrics and Gynecology

## 2023-11-06 DIAGNOSIS — O321XX0 Maternal care for breech presentation, not applicable or unspecified: Secondary | ICD-10-CM | POA: Diagnosis not present

## 2023-11-06 DIAGNOSIS — Z98891 History of uterine scar from previous surgery: Secondary | ICD-10-CM

## 2023-11-06 DIAGNOSIS — Z3A39 39 weeks gestation of pregnancy: Secondary | ICD-10-CM

## 2023-11-06 DIAGNOSIS — K219 Gastro-esophageal reflux disease without esophagitis: Secondary | ICD-10-CM | POA: Diagnosis present

## 2023-11-06 DIAGNOSIS — Z419 Encounter for procedure for purposes other than remedying health state, unspecified: Secondary | ICD-10-CM

## 2023-11-06 DIAGNOSIS — O9962 Diseases of the digestive system complicating childbirth: Secondary | ICD-10-CM | POA: Diagnosis present

## 2023-11-06 LAB — ABO/RH: ABO/RH(D): O POS

## 2023-11-06 SURGERY — Surgical Case
Anesthesia: Spinal | Site: Abdomen

## 2023-11-06 MED ORDER — MENTHOL 3 MG MT LOZG: 1.0000 | LOZENGE | OROMUCOSAL | Status: DC | PRN

## 2023-11-06 MED ORDER — BUPIVACAINE HCL 0.25 % IJ SOLN
INTRAMUSCULAR | Status: DC | PRN
  Administered 2023-11-06: 20 mL

## 2023-11-06 MED ORDER — ZOLPIDEM TARTRATE 5 MG PO TABS: 5.0000 mg | ORAL_TABLET | Freq: Every evening | ORAL | Status: DC | PRN

## 2023-11-06 MED ORDER — ONDANSETRON HCL 4 MG/2ML IJ SOLN
INTRAMUSCULAR | Status: DC | PRN
  Administered 2023-11-06: 4 mg via INTRAVENOUS

## 2023-11-06 MED ORDER — PHENYLEPHRINE HCL (PRESSORS) 10 MG/ML IV SOLN
INTRAVENOUS | Status: DC | PRN
  Administered 2023-11-06 (×2): 80 ug via INTRAVENOUS

## 2023-11-06 MED ORDER — STERILE WATER FOR IRRIGATION IR SOLN
Status: DC | PRN
  Administered 2023-11-06: 1000 mL

## 2023-11-06 MED ORDER — FENTANYL CITRATE (PF) 100 MCG/2ML IJ SOLN
INTRAMUSCULAR | Status: AC
  Filled 2023-11-06: qty 2

## 2023-11-06 MED ORDER — BUPIVACAINE LIPOSOME 1.3 % IJ SUSP
INTRAMUSCULAR | Status: DC | PRN
  Administered 2023-11-06: 20 mL

## 2023-11-06 MED ORDER — COCONUT OIL OIL
1.0000 | TOPICAL_OIL | Status: DC | PRN
  Administered 2023-11-07: 1 via TOPICAL

## 2023-11-06 MED ORDER — ONDANSETRON HCL 4 MG/2ML IJ SOLN
INTRAMUSCULAR | Status: AC
  Filled 2023-11-06: qty 2

## 2023-11-06 MED ORDER — ACETAMINOPHEN 10 MG/ML IV SOLN
INTRAVENOUS | Status: DC | PRN
  Administered 2023-11-06: 1000 mg via INTRAVENOUS

## 2023-11-06 MED ORDER — PHENYLEPHRINE HCL-NACL 20-0.9 MG/250ML-% IV SOLN
INTRAVENOUS | Status: DC | PRN
  Administered 2023-11-06: 60 ug/min via INTRAVENOUS

## 2023-11-06 MED ORDER — OXYCODONE HCL 5 MG PO TABS: 5.0000 mg | ORAL_TABLET | ORAL | Status: DC | PRN

## 2023-11-06 MED ORDER — SIMETHICONE 80 MG PO CHEW: 80.0000 mg | CHEWABLE_TABLET | ORAL | Status: DC | PRN

## 2023-11-06 MED ORDER — KETOROLAC TROMETHAMINE 30 MG/ML IJ SOLN: 30.0000 mg | Freq: Four times a day (QID) | INTRAMUSCULAR | Status: AC | PRN

## 2023-11-06 MED ORDER — CEFOTETAN DISODIUM 2 G IJ SOLR
2.0000 g | INTRAMUSCULAR | Status: AC
  Administered 2023-11-06: 2 g via INTRAVENOUS

## 2023-11-06 MED ORDER — ACETAMINOPHEN 500 MG PO TABS
1000.0000 mg | ORAL_TABLET | Freq: Four times a day (QID) | ORAL | Status: AC
Start: 2023-11-06 — End: 2023-11-07
  Administered 2023-11-06 – 2023-11-07 (×4): 1000 mg via ORAL
  Filled 2023-11-06 (×5): qty 2

## 2023-11-06 MED ORDER — BUPIVACAINE IN DEXTROSE 0.75-8.25 % IT SOLN
INTRATHECAL | Status: DC | PRN
Start: 2023-11-06 — End: 2023-11-06
  Administered 2023-11-06: 1.6 mL via INTRATHECAL

## 2023-11-06 MED ORDER — WITCH HAZEL-GLYCERIN EX PADS: 1.0000 | MEDICATED_PAD | CUTANEOUS | Status: DC | PRN

## 2023-11-06 MED ORDER — SODIUM CHLORIDE 0.9 % IV SOLN
INTRAVENOUS | Status: AC
  Filled 2023-11-06: qty 2

## 2023-11-06 MED ORDER — LACTATED RINGERS IV SOLN: INTRAVENOUS | Status: DC

## 2023-11-06 MED ORDER — SODIUM CHLORIDE 0.9 % IR SOLN
Status: DC | PRN
  Administered 2023-11-06: 1000 mL

## 2023-11-06 MED ORDER — OXYTOCIN-SODIUM CHLORIDE 30-0.9 UT/500ML-% IV SOLN
INTRAVENOUS | Status: DC | PRN
  Administered 2023-11-06: 300 mL via INTRAVENOUS

## 2023-11-06 MED ORDER — DEXAMETHASONE SODIUM PHOSPHATE 10 MG/ML IJ SOLN
INTRAMUSCULAR | Status: AC
Start: 2023-11-06 — End: ?
  Filled 2023-11-06: qty 1

## 2023-11-06 MED ORDER — DROPERIDOL 2.5 MG/ML IJ SOLN: 0.6250 mg | Freq: Once | INTRAMUSCULAR | Status: DC | PRN

## 2023-11-06 MED ORDER — FENTANYL CITRATE (PF) 100 MCG/2ML IJ SOLN: 25.0000 ug | INTRAMUSCULAR | Status: DC | PRN

## 2023-11-06 MED ORDER — KETOROLAC TROMETHAMINE 30 MG/ML IJ SOLN
30.0000 mg | Freq: Once | INTRAMUSCULAR | Status: AC
  Administered 2023-11-06: 30 mg via INTRAVENOUS

## 2023-11-06 MED ORDER — SIMETHICONE 80 MG PO CHEW
80.0000 mg | CHEWABLE_TABLET | Freq: Three times a day (TID) | ORAL | Status: DC
Start: 2023-11-06 — End: 2023-11-08
  Administered 2023-11-06 – 2023-11-08 (×7): 80 mg via ORAL
  Filled 2023-11-06 (×6): qty 1

## 2023-11-06 MED ORDER — SODIUM CHLORIDE 0.9% FLUSH: 3.0000 mL | INTRAVENOUS | Status: DC | PRN

## 2023-11-06 MED ORDER — BUPIVACAINE HCL (PF) 0.25 % IJ SOLN
INTRAMUSCULAR | Status: AC
  Filled 2023-11-06: qty 10

## 2023-11-06 MED ORDER — MORPHINE SULFATE (PF) 0.5 MG/ML IJ SOLN
INTRAMUSCULAR | Status: DC | PRN
  Administered 2023-11-06: 150 ug via INTRATHECAL

## 2023-11-06 MED ORDER — NALOXONE HCL 4 MG/10ML IJ SOLN: 1.0000 ug/kg/h | INTRAVENOUS | Status: DC | PRN

## 2023-11-06 MED ORDER — PRENATAL MULTIVITAMIN CH
1.0000 | ORAL_TABLET | Freq: Every day | ORAL | Status: DC
  Administered 2023-11-06 – 2023-11-08 (×3): 1 via ORAL
  Filled 2023-11-06 (×3): qty 1

## 2023-11-06 MED ORDER — KETOROLAC TROMETHAMINE 30 MG/ML IJ SOLN
INTRAMUSCULAR | Status: AC
Start: 2023-11-06 — End: ?
  Filled 2023-11-06: qty 1

## 2023-11-06 MED ORDER — PHENYLEPHRINE 80 MCG/ML (10ML) SYRINGE FOR IV PUSH (FOR BLOOD PRESSURE SUPPORT)
PREFILLED_SYRINGE | INTRAVENOUS | Status: DC | PRN
Start: 2023-11-06 — End: 2023-11-06

## 2023-11-06 MED ORDER — DIPHENHYDRAMINE HCL 50 MG/ML IJ SOLN: 12.5000 mg | INTRAMUSCULAR | Status: DC | PRN

## 2023-11-06 MED ORDER — BUPIVACAINE LIPOSOME 1.3 % IJ SUSP
20.0000 mL | Freq: Once | INTRAMUSCULAR | Status: DC
  Filled 2023-11-06: qty 20

## 2023-11-06 MED ORDER — NALOXONE HCL 0.4 MG/ML IJ SOLN: 0.4000 mg | INTRAMUSCULAR | Status: DC | PRN

## 2023-11-06 MED ORDER — POVIDONE-IODINE 10 % EX SWAB
2.0000 | Freq: Once | CUTANEOUS | Status: AC
  Administered 2023-11-06: 2 via TOPICAL

## 2023-11-06 MED ORDER — DIPHENHYDRAMINE HCL 25 MG PO CAPS: 25.0000 mg | ORAL_CAPSULE | Freq: Four times a day (QID) | ORAL | Status: DC | PRN

## 2023-11-06 MED ORDER — MORPHINE SULFATE (PF) 0.5 MG/ML IJ SOLN
INTRAMUSCULAR | Status: AC
  Filled 2023-11-06: qty 10

## 2023-11-06 MED ORDER — FENTANYL CITRATE (PF) 100 MCG/2ML IJ SOLN
INTRAMUSCULAR | Status: DC | PRN
  Administered 2023-11-06: 15 ug via INTRATHECAL

## 2023-11-06 MED ORDER — DEXAMETHASONE SODIUM PHOSPHATE 10 MG/ML IJ SOLN
INTRAMUSCULAR | Status: DC | PRN
  Administered 2023-11-06: 10 mg via INTRAVENOUS

## 2023-11-06 MED ORDER — ACETAMINOPHEN 325 MG PO TABS
650.0000 mg | ORAL_TABLET | ORAL | Status: DC | PRN
  Administered 2023-11-07 – 2023-11-08 (×2): 650 mg via ORAL
  Filled 2023-11-06 (×2): qty 2

## 2023-11-06 MED ORDER — OXYTOCIN-SODIUM CHLORIDE 30-0.9 UT/500ML-% IV SOLN
2.5000 [IU]/h | INTRAVENOUS | Status: AC
  Administered 2023-11-06: 2.5 [IU]/h via INTRAVENOUS

## 2023-11-06 MED ORDER — IBUPROFEN 600 MG PO TABS
600.0000 mg | ORAL_TABLET | Freq: Four times a day (QID) | ORAL | Status: DC
  Administered 2023-11-06 – 2023-11-08 (×8): 600 mg via ORAL
  Filled 2023-11-06 (×8): qty 1

## 2023-11-06 MED ORDER — ONDANSETRON HCL 4 MG/2ML IJ SOLN: 4.0000 mg | Freq: Three times a day (TID) | INTRAMUSCULAR | Status: DC | PRN

## 2023-11-06 MED ORDER — PHENYLEPHRINE HCL-NACL 20-0.9 MG/250ML-% IV SOLN
INTRAVENOUS | Status: AC
  Filled 2023-11-06: qty 250

## 2023-11-06 MED ORDER — DIBUCAINE (PERIANAL) 1 % EX OINT: 1.0000 | TOPICAL_OINTMENT | CUTANEOUS | Status: DC | PRN

## 2023-11-06 MED ORDER — DIPHENHYDRAMINE HCL 25 MG PO CAPS: 25.0000 mg | ORAL_CAPSULE | ORAL | Status: DC | PRN

## 2023-11-06 MED ORDER — SENNOSIDES-DOCUSATE SODIUM 8.6-50 MG PO TABS
2.0000 | ORAL_TABLET | Freq: Every day | ORAL | Status: DC
  Administered 2023-11-07 – 2023-11-08 (×2): 2 via ORAL
  Filled 2023-11-06 (×2): qty 2

## 2023-11-06 SURGICAL SUPPLY — 29 items
BARRIER ADHS 3X4 INTERCEED (GAUZE/BANDAGES/DRESSINGS) IMPLANT
BENZOIN TINCTURE PRP APPL 2/3 (GAUZE/BANDAGES/DRESSINGS) IMPLANT
CHLORAPREP W/TINT 26 (MISCELLANEOUS) ×2 IMPLANT
CLAMP UMBILICAL CORD (MISCELLANEOUS) ×1 IMPLANT
CLOTH BEACON ORANGE TIMEOUT ST (SAFETY) ×1 IMPLANT
DRSG OPSITE POSTOP 4X10 (GAUZE/BANDAGES/DRESSINGS) ×1 IMPLANT
ELECTRODE REM PT RTRN 9FT ADLT (ELECTROSURGICAL) ×1 IMPLANT
EXTRACTOR VACUUM M CUP 4 TUBE (SUCTIONS) IMPLANT
GLOVE BIO SURGEON STRL SZ 6.5 (GLOVE) ×1 IMPLANT
GLOVE BIOGEL PI IND STRL 7.0 (GLOVE) ×1 IMPLANT
GOWN STRL REUS W/TWL LRG LVL3 (GOWN DISPOSABLE) ×2 IMPLANT
KIT ABG SYR 3ML LUER SLIP (SYRINGE) IMPLANT
NDL HYPO 25X5/8 SAFETYGLIDE (NEEDLE) ×1 IMPLANT
NEEDLE HYPO 22GX1.5 SAFETY (NEEDLE) IMPLANT
NEEDLE HYPO 25X5/8 SAFETYGLIDE (NEEDLE) ×1 IMPLANT
NS IRRIG 1000ML POUR BTL (IV SOLUTION) ×1 IMPLANT
PACK C SECTION WH (CUSTOM PROCEDURE TRAY) ×1 IMPLANT
PAD OB MATERNITY 4.3X12.25 (PERSONAL CARE ITEMS) ×1 IMPLANT
STRIP CLOSURE SKIN 1/2X4 (GAUZE/BANDAGES/DRESSINGS) IMPLANT
SUT CHROMIC 0 CTX 36 (SUTURE) ×2 IMPLANT
SUT PLAIN 0 NONE (SUTURE) IMPLANT
SUT PLAIN 2 0 XLH (SUTURE) IMPLANT
SUT VIC AB 0 CT1 27XBRD ANBCTR (SUTURE) ×3 IMPLANT
SUT VIC AB 4-0 KS 27 (SUTURE) IMPLANT
SYR CONTROL 10ML LL (SYRINGE) IMPLANT
TOWEL OR 17X24 6PK STRL BLUE (TOWEL DISPOSABLE) ×1 IMPLANT
TRAY FOLEY W/BAG SLVR 14FR LF (SET/KITS/TRAYS/PACK) ×1 IMPLANT
VACUUM CUP M-STYLE MYSTIC II (SUCTIONS) IMPLANT
WATER STERILE IRR 1000ML POUR (IV SOLUTION) ×1 IMPLANT

## 2023-11-06 NOTE — Transfer of Care (Signed)
 Immediate Anesthesia Transfer of Care Note  Patient: Rachael [name]  Procedure(s) Performed: CESAREAN DELIVERY (Abdomen)  Patient Location: PACU  Anesthesia Type:Spinal  Level of Consciousness: awake, alert , and oriented  Airway & Oxygen Therapy: Patient Spontanous Breathing  Post-op Assessment: Report given to RN and Post -op Vital signs reviewed and stable  Post vital signs: Reviewed and stable  Last Vitals:  Vitals Value Taken Time  BP 97/62 11/06/23 0845  Temp    Pulse 62 11/06/23 0847  Resp 21 11/06/23 0847  SpO2 96 % 11/06/23 0847  Vitals shown include unfiled device data.  Last Pain:  Vitals:   11/06/23 0605  PainSc: 4          Complications: No notable events documented.

## 2023-11-06 NOTE — Brief Op Note (Signed)
 11/06/2023  8:28 AM  PATIENT:  Scott Cutting  38 y.o. female  PRE-OPERATIVE DIAGNOSIS:   IUP at 4 w 1 day Breech Presentation  POST-OPERATIVE DIAGNOSIS:   Same  PROCEDURE:  Procedure(s): CESAREAN DELIVERY (N/A)  SURGEON:  Surgeons and Role:    * Thurman Flores, MD - Primary  PHYSICIAN ASSISTANT:   ASSISTANTS: none   ANESTHESIA:   local and spinal  EBL:  per anesthesia record    BLOOD ADMINISTERED:none  DRAINS: Urinary Catheter (Foley)   LOCAL MEDICATIONS USED:  OTHER exparel    SPECIMEN:  No Specimen  DISPOSITION OF SPECIMEN:  N/A  COUNTS:  YES  TOURNIQUET:  * No tourniquets in log *  DICTATION: .Other Dictation: Dictation Number dictated  PLAN OF CARE: Admit to inpatient   PATIENT DISPOSITION:  PACU - hemodynamically stable.   Delay start of Pharmacological VTE agent (>24hrs) due to surgical blood loss or risk of bleeding: not applicable

## 2023-11-06 NOTE — Op Note (Signed)
 [name], [name] MEDICAL RECORD NO: [id number] ACCOUNT NO: [id number] DATE OF BIRTH: 29-May-1986 FACILITY: [facility] LOCATION: [id number] PHYSICIAN: Alesana Magistro L. Serge Dancer, MD  Operative Report   DATE OF PROCEDURE: 11/06/2023   PREOPERATIVE DIAGNOSES: 1. Intrauterine pregnancy at 39 weeks and 1 day. 2. Breech presentation.  POSTOPERATIVE DIAGNOSES: 1. Intrauterine pregnancy at 39 weeks and 1 day. 2. Breech presentation.  PROCEDURE: Primary low transverse cesarean section.  SURGEON: Karimah Winquist L. Serge Dancer, MD.  ANESTHESIA: Spinal. EBL per anesthesia record.  COMPLICATIONS: None.  DRAINS: Foley catheter.  PATHOLOGY: None.  DESCRIPTION OF PROCEDURE: The patient was taken to the operating room after informed consent was obtained. She was then prepped and draped in the usual sterile fashion. A Foley catheter had been inserted without difficulty and draining clear urine. Allis  test was performed. A low transverse incision was made and carried down to the fascia. The fascia scored in the midline extended laterally. The rectus muscles were separated in the midline. The peritoneum was entered bluntly. The peritoneal incision was  then stretched. The bladder blade was inserted. The bladder flap was developed in standard fashion. A low transverse incision was made in the uterus. The uterus was entered using a hemostat. Amniotic fluid was clear. The baby was in frank breech  presentation and delivered very easily. The baby was a female infant. Apgars 8 at 1 minute and 9 at 5 minutes. The cord was clamped and cut. The baby was handed to the neonatal team. The placenta was removed manually, noted to be normal and intact with a  three-vessel cord. The uterus was cleared of all clots and debris. The uterine incision then was closed in two layers using 0 chromic and a running lock stitch. Hemostasis was very good. The uterus appeared to be normal. Irrigation was performed.  Hemostasis was very good. The  peritoneum was closed using 0 Vicryl. The fascia was closed using 0 Vicryl using Exparel  and Marcaine  combination. This was infiltrated below and above the fascia and subcutaneous. The subcuticular was closed with plain gut  interrupted. The skin was closed with 3-0 Vicryl on a Keith needle. Benzoin, Steri-Strips, and a honeycomb dressing were applied. All sponge, lap, and instrument counts were correct x2. The patient went to the recovery room in stable condition.    Amey.Baba D: 11/06/2023 8:34:05 am T: 11/06/2023 9:08:00 am  JOB: [id number]/ [id number]

## 2023-11-06 NOTE — Anesthesia Procedure Notes (Signed)
 Spinal  Patient location during procedure: OR Start time: 11/06/2023 7:36 AM End time: 11/06/2023 7:39 AM Reason for block: surgical anesthesia Staffing Performed: anesthesiologist  Anesthesiologist: Vernadine Golas, MD Performed by: Vernadine Golas, MD Authorized by: Vernadine Golas, MD   Preanesthetic Checklist Completed: patient identified, IV checked, risks and benefits discussed, monitors and equipment checked, pre-op evaluation and timeout performed Spinal Block Patient position: sitting Prep: DuraPrep and site prepped and draped Patient monitoring: heart rate, continuous pulse ox and blood pressure Approach: midline Location: L3-4 Injection technique: single-shot Needle Needle type: Pencan  Needle gauge: 24 G Needle length: 10 cm Assessment Sensory level: T4 Events: CSF return Additional Notes Risks, benefits, and alternative discussed. Patient gave consent to procedure. Prepped and draped in sitting position. Clear CSF obtained after one needle pass. Positive terminal aspiration. No pain or paraesthesias with injection. Patient tolerated procedure well. Vital signs stable. Amador Junes, MD

## 2023-11-06 NOTE — Lactation Note (Signed)
 This note was copied from a baby's chart. Lactation Consultation Note  Patient Name: Rachael [name] [id number] Date: 11/06/2023 Age:40 hours Reason for consult: Initial assessment;Primapara;1st time breastfeeding;Term;Breastfeeding assistance  P1 - pt consulted for initial assessment at 7 hours PP. Pt and baby skin to skin. Desired LC support with breastfeeding. LC discussed latch-on and positioning via biological position due to maternal state/inability to sit up right. Supported latching with placement of infant to breast. Pt able to hold after LC assist. Taught parents how to observe nutritive vs non-nutritive latching, suck-swallow-breathe pattern and discussed importance of offering a burp although baby may not always do so. Infant "[name]" was able to latch and feed nutritively for 20 minutes observed by LC.   Pt requested LC to check breasts for colostrum via hand expression. Reports not being able to express milk from the right but could from the left. Colostrum expresses well from both. Advised to not squeeze the nipple when hand expressing but to cup the breast, push back into the tissue and then roll forward towards the nipple (leaving room). Parents verbalized understanding.  Discussed spoon feeding as alternative feeding method. Reviewed breastfeeding basics education and benefits of STS for both parents. Taught dad how to change infant's diaper (1 stool 1 pee present). Followed up with teaching parents how to swaddle infant.   Parents were thankful for LC support. Encouraged parents to call out as needed for further assistance.   Maternal Data Has patient been taught Hand Expression?: Yes Does the patient have breastfeeding experience prior to this delivery?: No  Feeding Mother's Current Feeding Choice: Breast Milk  LATCH Score Latch: Grasps breast easily, tongue down, lips flanged, rhythmical sucking.  Audible Swallowing: Spontaneous and intermittent  Type of Nipple: Everted  at rest and after stimulation  Comfort (Breast/Nipple): Soft / non-tender  Hold (Positioning): Assistance needed to correctly position infant at breast and maintain latch.  LATCH Score: 9   Lactation Tools Discussed/Used    Interventions Interventions: Breast feeding basics reviewed;Assisted with latch;Skin to skin;Hand express;Adjust position;Support pillows;Position options;Education;LC Services brochure  Discharge Pump: Personal;Hands Free (MomCozy via [employer])  Consult Status Consult Status: Follow-up Date: 11/07/23 Follow-up type: In-patient    Ardia Kraft 11/06/2023, 3:28 PM

## 2023-11-06 NOTE — H&P (Signed)
 Rachael [name] is a 38 y.o. G 2 P 0 at 4 w 1 day presents for c section for breech presentation.  OB History     Gravida  2   Para      Term      Preterm      AB  1   Living         SAB  1   IAB      Ectopic      Multiple      Live Births             Past Medical History:  Diagnosis Date   ADHD    GERD (gastroesophageal reflux disease)    Migraines    Past Surgical History:  Procedure Laterality Date   BREAST BIOPSY  2012   COLONOSCOPY  2011   DILATION AND EVACUATION N/A 08/05/2022   Procedure: DILATATION AND EVACUATION;  Surgeon: Thurman Flores, MD;  Location: [facility] [place];  Service: Gynecology;  Laterality: N/A;   ESOPHAGOGASTRODUODENOSCOPY     2 yrs ago  08/01/2022   Family History: family history includes Breast cancer in her paternal aunt; Cancer in her maternal grandfather; Colon cancer in her father and paternal aunt; Migraines in her mother; Ovarian cancer in her paternal aunt; Prostate cancer in her maternal grandfather and maternal uncle; Uterine cancer in her paternal aunt. Social History:  reports that she has never smoked. She has never used smokeless tobacco. She reports current alcohol use. She reports that she does not use drugs.     Maternal Diabetes: No Genetic Screening: Normal Maternal Ultrasounds/Referrals: Normal Fetal Ultrasounds or other Referrals:  None Maternal Substance Abuse:  No Significant Maternal Medications:  None Significant Maternal Lab Results:  None Number of Prenatal Visits:greater than 3 verified prenatal visits Maternal Vaccinations:Flu Other Comments:  None  Review of Systems History   Blood pressure 128/81, pulse 76, temperature 98.2 F (36.8 C), height 5\' 2"  (1.575 m), weight 84 kg, last menstrual period 04/03/2022, SpO2 98%, unknown if currently breastfeeding. Maternal Exam:  Uterine Assessment: Contraction frequency is irregular.  Abdomen: Fetal presentation: breech   Fetal  Exam Fetal State Assessment: Category I - tracings are normal.   Physical Exam Vitals and nursing note reviewed. Exam conducted with a chaperone present.  Constitutional:      Appearance: Normal appearance.  HENT:     Head: Normocephalic.  Cardiovascular:     Rate and Rhythm: Normal rate and regular rhythm.  Pulmonary:     Effort: Pulmonary effort is normal.  Neurological:     Mental Status: She is alert.     Prenatal labs: ABO, Rh: --/--/PENDING (05/02 4098) Antibody: NEG (04/30 0944) Rubella: Immune (10/14 0000) RPR: NON REACTIVE (04/30 1000)  HBsAg: Negative (10/14 0000)  HIV: Non-reactive (10/14 0000)  GBS:     Assessment/Plan: IUP at term Breech presentation    Martine Sleek 11/06/2023, 7:08 AM

## 2023-11-06 NOTE — Anesthesia Postprocedure Evaluation (Signed)
 Anesthesia Post Note  Patient: Rachael [name]  Procedure(s) Performed: CESAREAN DELIVERY (Abdomen)     Patient location during evaluation: PACU Anesthesia Type: Spinal Level of consciousness: awake and alert Pain management: pain level controlled Vital Signs Assessment: post-procedure vital signs reviewed and stable Respiratory status: spontaneous breathing, nonlabored ventilation and respiratory function stable Cardiovascular status: blood pressure returned to baseline Postop Assessment: no apparent nausea or vomiting, spinal receding, no headache and no backache Anesthetic complications: no   No notable events documented.  Last Vitals:  Vitals:   11/06/23 0930 11/06/23 0944  BP: 96/70 104/70  Pulse: (!) 59 (!) 56  Resp: 12 16  Temp:    SpO2: 98% 98%    Last Pain:  Vitals:   11/06/23 0915  PainSc: 0-No pain   Pain Goal:    LLE Motor Response: Purposeful movement (11/06/23 0930) LLE Sensation: Tingling (11/06/23 0930) RLE Motor Response: Purposeful movement (11/06/23 0930) RLE Sensation: Tingling (11/06/23 0930)     Epidural/Spinal Function Cutaneous sensation: Tingles (11/06/23 0930), Patient able to flex knees: No (11/06/23 0930), Patient able to lift hips off bed: No (11/06/23 0930), Back pain beyond tenderness at insertion site: No (11/06/23 0930), Progressively worsening motor and/or sensory loss: No (11/06/23 0930), Bowel and/or bladder incontinence post epidural: No (11/06/23 0930)  Rayfield Cairo

## 2023-11-07 ENCOUNTER — Encounter (HOSPITAL_COMMUNITY): Payer: Self-pay | Admitting: Obstetrics and Gynecology

## 2023-11-07 LAB — CBC
HCT: 29.3 % — ABNORMAL LOW (ref 36.0–46.0)
Hemoglobin: 10.4 g/dL — ABNORMAL LOW (ref 12.0–15.0)
MCH: 31.9 pg (ref 26.0–34.0)
MCHC: 35.5 g/dL (ref 30.0–36.0)
MCV: 89.9 fL (ref 80.0–100.0)
Platelets: 130 10*3/uL — ABNORMAL LOW (ref 150–400)
RBC: 3.26 MIL/uL — ABNORMAL LOW (ref 3.87–5.11)
RDW: 13.6 % (ref 11.5–15.5)
WBC: 12.7 10*3/uL — ABNORMAL HIGH (ref 4.0–10.5)
nRBC: 0 % (ref 0.0–0.2)

## 2023-11-07 LAB — BIRTH TISSUE RECOVERY COLLECTION (PLACENTA DONATION)

## 2023-11-07 MED ORDER — EPINEPHRINE TOPICAL FOR CIRCUMCISION 0.1 MG/ML: 1.0000 [drp] | TOPICAL | Status: DC | PRN

## 2023-11-07 MED ORDER — WHITE PETROLATUM EX OINT: 1.0000 | TOPICAL_OINTMENT | CUTANEOUS | Status: DC | PRN

## 2023-11-07 MED ORDER — SUCROSE 24% NICU/PEDS ORAL SOLUTION: 0.5000 mL | OROMUCOSAL | Status: DC | PRN

## 2023-11-07 MED ORDER — LIDOCAINE 1% INJECTION FOR CIRCUMCISION
0.8000 mL | INJECTION | Freq: Once | INTRAVENOUS | Status: DC
  Filled 2023-11-07: qty 1

## 2023-11-07 MED ORDER — GELATIN ABSORBABLE 12-7 MM EX MISC: 1.0000 | Freq: Once | CUTANEOUS | Status: DC | PRN

## 2023-11-07 NOTE — Progress Notes (Signed)
 POD # 1  Doing well.  BP 104/63 Comment: cluster care  Pulse 73   Temp 98.6 F (37 C) (Oral)   Resp 19   Ht 5\' 2"  (1.575 m)   Wt 84 kg   LMP 04/03/2022   SpO2 97%   Breastfeeding Unknown   BMI 33.87 kg/m  Results for orders placed or performed during the hospital encounter of 11/06/23 (from the past 24 hours)  CBC     Status: Abnormal   Collection Time: 11/07/23  5:25 AM  Result Value Ref Range   WBC 12.7 (H) 4.0 - 10.5 K/uL   RBC 3.26 (L) 3.87 - 5.11 MIL/uL   Hemoglobin 10.4 (L) 12.0 - 15.0 g/dL   HCT 16.1 (L) 09.6 - 04.5 %   MCV 89.9 80.0 - 100.0 fL   MCH 31.9 26.0 - 34.0 pg   MCHC 35.5 30.0 - 36.0 g/dL   RDW 40.9 81.1 - 91.4 %   Platelets 130 (L) 150 - 400 K/uL   nRBC 0.0 0.0 - 0.2 %  Collect bld for placenta donatation     Status: None   Collection Time: 11/07/23  5:25 AM  Result Value Ref Range   Placenta donation bld collect Collected by Laboratory    Abdomen is soft and non tender  Bandage clean and dry   POD # 1  Doing well Routine care  Discharge home tomorrow Circ today.

## 2023-11-08 NOTE — Discharge Summary (Signed)
 Postpartum Discharge Summary  Date of Service Nov 08, 2023     Patient Name: Rachael [name] DOB: 24-Jan-1986 MRN: [id number]  Date of admission: 11/06/2023 Delivery date:11/06/2023 Delivering provider: Thurman Flores Date of discharge: 11/08/2023  Admitting diagnosis: Surgery, elective [Z41.9] S/P cesarean section [Z98.891] Intrauterine pregnancy: [redacted]w[redacted]d     Secondary diagnosis:  Principal Problem:   Surgery, elective Active Problems:   S/P cesarean section  Additional problems: none    Discharge diagnosis: Term Pregnancy Delivered                                              Post partum procedures: not applicable Augmentation: N/A Complications: None  Hospital course: Sceduled C/S   38 y.o. yo G2P1011 at [redacted]w[redacted]d was admitted to the hospital 11/06/2023 for scheduled cesarean section with the following indication:Malpresentation.Delivery details are as follows:  Membrane Rupture Time/Date: 12:58 AM,11/06/2023  Delivery Method:C-Section, Low Transverse Operative Delivery:N/A Details of operation can be found in separate operative note.  Patient had a postpartum course complicated by nothing.  She is ambulating, tolerating a regular diet, passing flatus, and urinating well. Patient is discharged home in stable condition on  11/08/23        Newborn Data: Birth date:11/06/2023 Birth time:8:00 AM Gender:Female Living status:Living Apgars:9 ,9  Weight:3970 g    Magnesium Sulfate received: No BMZ received: No Rhophylac:N/A MMR:N/A T-DaP:Given prenatally Flu: N/A RSV Vaccine received: No Transfusion:No Immunizations administered: Immunization History  Administered Date(s) Administered   HPV 9-valent 04/13/2019, 06/22/2019, 10/24/2019   Influenza-Unspecified 04/01/2016, 04/01/2019, 05/01/2020, 04/03/2022   PFIZER(Purple Top)SARS-COV-2 Vaccination 06/26/2019, 07/16/2019, 04/14/2020   Tdap 10/17/2014    Physical exam  Vitals:   11/07/23 1448 11/07/23 2107 11/08/23 0244 11/08/23  0530  BP: 106/66 115/82 104/62 107/63  Pulse: 78 80 72 78  Resp: 16 17 18 18   Temp: 98.7 F (37.1 C) 98.3 F (36.8 C) 98.7 F (37.1 C) 98.8 F (37.1 C)  TempSrc: Oral Oral Oral Oral  SpO2: 98% 99% 99%   Weight:      Height:       General: alert, cooperative, and no distress Lochia: appropriate Uterine Fundus: firm Incision: Healing well with no significant drainage DVT Evaluation: No evidence of DVT seen on physical exam. Labs: Lab Results  Component Value Date   WBC 12.7 (H) 11/07/2023   HGB 10.4 (L) 11/07/2023   HCT 29.3 (L) 11/07/2023   MCV 89.9 11/07/2023   PLT 130 (L) 11/07/2023      Latest Ref Rng & Units 08/13/2022    8:08 AM  CMP  Glucose 70 - 99 mg/dL 94   BUN 6 - 23 mg/dL 9   Creatinine 4.09 - 8.11 mg/dL 9.14   Sodium 782 - 956 mEq/L 139   Potassium 3.5 - 5.1 mEq/L 4.2   Chloride 96 - 112 mEq/L 105   CO2 19 - 32 mEq/L 26   Calcium 8.4 - 10.5 mg/dL 8.7   Total Protein 6.0 - 8.3 g/dL 6.8   Total Bilirubin 0.2 - 1.2 mg/dL 0.5   Alkaline Phos 39 - 117 U/L 55   AST 0 - 37 U/L 14   ALT 0 - 35 U/L 11    Edinburgh Score:    11/07/2023    7:00 AM  Edinburgh Postnatal Depression Scale Screening Tool  I have been able to laugh and  see the funny side of things. 0  I have looked forward with enjoyment to things. 0  I have blamed myself unnecessarily when things went wrong. 2  I have been anxious or worried for no good reason. 2  I have felt scared or panicky for no good reason. 1  Things have been getting on top of me. 1  I have been so unhappy that I have had difficulty sleeping. 0  I have felt sad or miserable. 0  I have been so unhappy that I have been crying. 0  The thought of harming myself has occurred to me. 0  Edinburgh Postnatal Depression Scale Total 6      After visit meds:  Allergies as of 11/08/2023   No Known Allergies      Medication List     STOP taking these medications    buPROPion  300 MG 24 hr tablet Commonly known as: WELLBUTRIN   XL       TAKE these medications    CHOLINE PO Take 500 mg by mouth in the morning and at bedtime.   multivitamin-prenatal 27-0.8 MG Tabs tablet Take 1 tablet by mouth daily.   omeprazole  20 MG capsule Commonly known as: PRILOSEC Take 20 mg by mouth in the morning and at bedtime.   valACYclovir  500 MG tablet Commonly known as: VALTREX  Take 1 tablet (500 mg total) by mouth 2 (two) times daily.         Discharge home in stable condition Infant Feeding: Breast Infant Disposition:home with mother Discharge instruction: per After Visit Summary and Postpartum booklet. Activity: Advance as tolerated. Pelvic rest for 6 weeks.  Diet: routine diet Anticipated Birth Control: Unsure Postpartum Appointment:6 weeks Additional Postpartum F/U:  not applicable Future Appointments:No future appointments. Follow up Visit:      11/08/2023 Martine Sleek, MD

## 2023-11-08 NOTE — Lactation Note (Signed)
 This note was copied from a baby's chart. Lactation Consultation Note  Patient Name: Rachael [name] [id number] Date: 11/08/2023 Age:40 hours Reason for consult: RN request;Primapara;Term Mom was having chills, not feeling well breast pain. RN called LC to inform. LC assessed mom noted breast filling, knots and tender. Mom shown how to use DEBP & how to disassemble, clean, & reassemble parts. Mom pumped collecting 10 ml. Some relief noted to breast. Mom went from chills to being very hot.  Mom became nauseated when pumping started. Mom appreciative of LC seeing her. Suggested pump in 3 hrs BF when she can. Mom excited that she has BM. Maternal Data Has patient been taught Hand Expression?: Yes Does the patient have breastfeeding experience prior to this delivery?: No  Feeding Mother's Current Feeding Choice: Breast Milk and Formula Nipple Type: Nfant Standard Flow (white)  LATCH Score       Type of Nipple: Everted at rest and after stimulation  Comfort (Breast/Nipple): Filling, red/small blisters or bruises, mild/mod discomfort (filling/knots/tender)         Lactation Tools Discussed/Used Tools: Pump;Flanges Flange Size: 24 Breast pump type: Double-Electric Breast Pump Pump Education: Setup, frequency, and cleaning;Milk Storage Reason for Pumping: breast hurting, chills, knots in breast Pumping frequency: q 3hr Pumped volume: 10 mL  Interventions Interventions: Breast massage;Hand express;Breast compression;DEBP  Discharge Discharge Education: Engorgement and breast care  Consult Status Consult Status: Follow-up Date: 11/08/23 Follow-up type: In-patient    Jaqualyn Juday G 11/08/2023, 4:43 AM

## 2023-11-16 ENCOUNTER — Encounter (HOSPITAL_COMMUNITY): Payer: Self-pay | Admitting: Obstetrics and Gynecology

## 2023-11-23 ENCOUNTER — Telehealth (HOSPITAL_COMMUNITY): Payer: Self-pay | Admitting: *Deleted

## 2023-11-23 NOTE — Telephone Encounter (Signed)
 Attempted hospital discharge follow-up call. Left message for patient to return RN call with any questions or concerns. Julien Odor, RN, 11/23/23, [phone]

## 2023-11-24 ENCOUNTER — Other Ambulatory Visit (HOSPITAL_COMMUNITY): Payer: Self-pay

## 2023-11-24 DIAGNOSIS — L7682 Other postprocedural complications of skin and subcutaneous tissue: Secondary | ICD-10-CM | POA: Diagnosis not present

## 2023-11-24 MED ORDER — GABAPENTIN 300 MG PO CAPS
300.0000 mg | ORAL_CAPSULE | Freq: Three times a day (TID) | ORAL | 1 refills | Status: AC | PRN
  Filled 2023-11-24 (×2): qty 30, 10d supply, fill #0

## 2024-01-01 ENCOUNTER — Other Ambulatory Visit (HOSPITAL_COMMUNITY): Payer: Self-pay

## 2024-01-01 MED ORDER — BUPROPION HCL ER (XL) 150 MG PO TB24
150.0000 mg | ORAL_TABLET | Freq: Every day | ORAL | 2 refills | Status: DC
  Filled 2024-01-01: qty 30, 30d supply, fill #0
  Filled 2024-02-04 – 2024-03-01 (×2): qty 30, 30d supply, fill #1
  Filled 2024-03-31 (×2): qty 30, 30d supply, fill #2

## 2024-02-04 ENCOUNTER — Other Ambulatory Visit (HOSPITAL_COMMUNITY): Payer: Self-pay

## 2024-02-15 ENCOUNTER — Other Ambulatory Visit (HOSPITAL_COMMUNITY): Payer: Self-pay

## 2024-03-01 ENCOUNTER — Other Ambulatory Visit (HOSPITAL_COMMUNITY): Payer: Self-pay

## 2024-03-01 DIAGNOSIS — Z1389 Encounter for screening for other disorder: Secondary | ICD-10-CM | POA: Diagnosis not present

## 2024-03-01 DIAGNOSIS — Z30018 Encounter for initial prescription of other contraceptives: Secondary | ICD-10-CM | POA: Diagnosis not present

## 2024-03-01 MED ORDER — ETONOGESTREL-ETHINYL ESTRADIOL 0.12-0.015 MG/24HR VA RING
1.0000 | VAGINAL_RING | VAGINAL | 4 refills | Status: AC
  Filled 2024-03-01: qty 3, 84d supply, fill #0
  Filled 2024-05-01 – 2024-05-27 (×3): qty 3, 84d supply, fill #1

## 2024-03-31 ENCOUNTER — Other Ambulatory Visit (HOSPITAL_COMMUNITY): Payer: Self-pay

## 2024-03-31 ENCOUNTER — Other Ambulatory Visit: Payer: Self-pay

## 2024-04-22 ENCOUNTER — Ambulatory Visit: Admitting: Family Medicine

## 2024-05-01 ENCOUNTER — Other Ambulatory Visit (HOSPITAL_COMMUNITY): Payer: Self-pay

## 2024-05-02 ENCOUNTER — Other Ambulatory Visit (HOSPITAL_COMMUNITY): Payer: Self-pay

## 2024-05-02 MED ORDER — BUPROPION HCL ER (XL) 150 MG PO TB24
150.0000 mg | ORAL_TABLET | Freq: Every day | ORAL | 3 refills | Status: AC
  Filled 2024-05-02: qty 30, 30d supply, fill #0
  Filled 2024-06-16: qty 30, 30d supply, fill #1

## 2024-05-09 ENCOUNTER — Other Ambulatory Visit (HOSPITAL_COMMUNITY): Payer: Self-pay

## 2024-05-19 ENCOUNTER — Other Ambulatory Visit (HOSPITAL_COMMUNITY): Payer: Self-pay

## 2024-05-23 NOTE — Progress Notes (Deleted)
" °  [name] [address] [place] [address] Phone: [phone] Subjective:    I'm seeing this patient by the request  of:  Kennyth Worth HERO, MD  CC: Back and neck pain follow-up  [name]  Rachael [name] is a 38 y.o. female coming in with complaint of back and neck pain. OMT March 2025.  Patient has been very busy with their first child.  Patient states   Medications patient has been prescribed: None  Taking:         Reviewed prior external information including notes and imaging from previsou exam, outside providers and external EMR if available.   As well as notes that were available from care everywhere and other healthcare systems.  Past medical history, social, surgical and family history all reviewed in electronic medical record.  No pertanent information unless stated regarding to the chief complaint.   Past Medical History:  Diagnosis Date   ADHD    GERD (gastroesophageal reflux disease)    Migraines     No Known Allergies   Review of Systems:  No headache, visual changes, nausea, vomiting, diarrhea, constipation, dizziness, abdominal pain, skin rash, fevers, chills, night sweats, weight loss, swollen lymph nodes, body aches, joint swelling, chest pain, shortness of breath, mood changes. POSITIVE muscle aches  Objective  unknown if currently breastfeeding.   General: No apparent distress alert and oriented x3 mood and affect normal, dressed appropriately.  HEENT: Pupils equal, extraocular movements intact  Respiratory: Patient's speak in full sentences and does not appear short of breath  Cardiovascular: No lower extremity edema, non tender, no erythema  Gait MSK:  Back   Osteopathic findings  C2 flexed rotated and side bent right C6 flexed rotated and side bent left T3 extended rotated and side bent right inhaled rib T9 extended rotated and side bent left L2 flexed rotated and side bent right Sacrum  right on right       Assessment and Plan:  No problem-specific Assessment & Plan notes found for this encounter.    Nonallopathic problems  Decision today to treat with OMT was based on Physical Exam  After verbal consent patient was treated with HVLA, ME, FPR techniques in cervical, rib, thoracic, lumbar, and sacral  areas  Patient tolerated the procedure well with improvement in symptoms  Patient given exercises, stretches and lifestyle modifications  See medications in patient instructions if given  Patient will follow up in 4-8 weeks    The above documentation has been reviewed and is accurate and complete Janina Trafton M Keldon Lassen, DO          Note: This dictation was prepared with Dragon dictation along with smaller phrase technology. Any transcriptional errors that result from this process are unintentional.         "

## 2024-05-26 ENCOUNTER — Ambulatory Visit: Admitting: Family Medicine

## 2024-07-01 ENCOUNTER — Telehealth: Admitting: Family Medicine

## 2024-07-01 ENCOUNTER — Other Ambulatory Visit (HOSPITAL_COMMUNITY): Payer: Self-pay

## 2024-07-01 DIAGNOSIS — R6889 Other general symptoms and signs: Secondary | ICD-10-CM

## 2024-07-01 DIAGNOSIS — R051 Acute cough: Secondary | ICD-10-CM

## 2024-07-01 DIAGNOSIS — Z20828 Contact with and (suspected) exposure to other viral communicable diseases: Secondary | ICD-10-CM | POA: Diagnosis not present

## 2024-07-01 MED ORDER — BENZONATATE 100 MG PO CAPS
200.0000 mg | ORAL_CAPSULE | Freq: Three times a day (TID) | ORAL | 0 refills | Status: AC | PRN
  Filled 2024-07-01: qty 30, 5d supply, fill #0

## 2024-07-01 MED ORDER — LIDOCAINE VISCOUS HCL 2 % MT SOLN
15.0000 mL | OROMUCOSAL | 0 refills | Status: AC | PRN
  Filled 2024-07-01: qty 100, 3d supply, fill #0
  Filled 2024-07-01: qty 100, 2d supply, fill #0

## 2024-07-01 NOTE — Progress Notes (Signed)
 E visit for Flu like symptoms   We are sorry that you are not feeling well.  Here is how we plan to help! Based on what you have shared with me it looks like you may have a respiratory virus that may be influenza.  Influenza or the flu is  an infection caused by a respiratory virus. The flu virus is highly contagious and persons who did not receive their yearly flu vaccination may catch the flu from close contact.  We have anti-viral medications to treat the viruses that cause this infection. They are not a cure and only shorten the course of the infection. These prescriptions are most effective when they are given within the first 2 days of flu symptoms. Antiviral medications are indicated if you have a high risk of complications from the flu. You should  also consider an antiviral medication if you are in close contact with someone who is at risk. These medications can help patients avoid complications from the flu but have side effects that you should know.   Possible side effects from Tamiflu  or oseltamivir  include nausea, vomiting, diarrhea, dizziness, headaches, eye redness, sleep problems or other respiratory symptoms. You should not take Tamiflu  if you have an allergy to oseltamivir  or any to the ingredients in Tamiflu ..   For nasal congestion, you may use an oral decongestant such as Mucinex D or if you have glaucoma or high blood pressure use plain Mucinex.  Saline nasal spray or nasal drops can help and can safely be used as often as needed for congestion.  If you have a sore or scratchy throat, use a saltwater gargle-  to  teaspoon of salt dissolved in a 4-ounce to 8-ounce glass of warm water .  Gargle the solution for approximately 15-30 seconds and then spit.  It is important not to swallow the solution.  You can also use throat lozenges/cough drops and Chloraseptic spray to help with throat pain or discomfort.  Warm or cold liquids can also be helpful in relieving throat  pain.  For headache, pain or general discomfort, you can use Ibuprofen  or Tylenol  as directed.   Some authorities believe that zinc sprays or the use of Echinacea may shorten the course of your symptoms.  I have prescribed the following medications to help lessen symptoms: I have prescribed Tessalon  Perles 100 mg. You may take 1-2 capsules every 8 hours as needed for cough I have also sent viscous lidocaine  for sore throat.   You are to isolate at home until you have been fever-free for at least 24 hours without a fever-reducing medication, and symptoms have been steadily improving for 24 hours.  If you must be around other household members who do not have symptoms, you need to make sure that both you and the family members are masking consistently with a high-quality mask.  If you note any worsening of symptoms despite treatment, please seek an in-person evaluation ASAP. If you note any significant shortness of breath or any chest pain, please seek ED evaluation. Please do not delay care!  ANYONE WHO HAS FLU SYMPTOMS SHOULD: Stay home. The flu is highly contagious and going out or to work exposes others! Be sure to drink plenty of fluids. Water  is fine as well as fruit juices, sodas and electrolyte beverages. You may want to stay away from caffeine or alcohol. If you are nauseated, try taking small sips of liquids. How do you know if you are getting enough fluid? Your urine should be a  pale yellow or almost colorless. Get rest. Taking a steamy shower or using a humidifier may help nasal congestion and ease sore throat pain. Using a saline nasal spray works much the same way. Cough drops, hard candies and sore throat lozenges may ease your cough. Line up a caregiver. Have someone check on you regularly.  GET HELP RIGHT AWAY IF: You cannot keep down liquids or your medications. You become short of breath Your fell like you are going to pass out or loose consciousness. Your symptoms persist  after you have completed your treatment plan  MAKE SURE YOU  Understand these instructions. Will watch your condition. Will get help right away if you are not doing well or get worse.  Your e-visit answers were reviewed by a board certified advanced clinical practitioner to complete your personal care plan.  Depending on the condition, your plan could have included both over the counter or prescription medications.  If there is a problem please reply  once you have received a response from your provider.  Your safety is important to us .  If you have drug allergies check your prescription carefully.    You can use MyChart to ask questions about todays visit, request a non-urgent call back, or ask for a work or school excuse for 24 hours related to this e-Visit. If it has been greater than 24 hours you will need to follow up with your provider, or enter a new e-Visit to address those concerns.  You will get an e-mail in the next two days asking about your experience.  I hope that your e-visit has been valuable and will speed your recovery. Thank you for using e-visits.   I have spent 5 minutes in review of e-visit questionnaire, review and updating patient chart, medical decision making and response to patient.   Kohen Reither, FNP
